# Patient Record
Sex: Female | Born: 1955 | Race: Black or African American | Hispanic: No | Smoking: Never smoker
Health system: Southern US, Community
[De-identification: ages and names within clinical notes are randomized; demographics above are authoritative.]

## PROBLEM LIST (undated history)

## (undated) DIAGNOSIS — G500 Trigeminal neuralgia: Secondary | ICD-10-CM

## (undated) DIAGNOSIS — I341 Nonrheumatic mitral (valve) prolapse: Secondary | ICD-10-CM

## (undated) DIAGNOSIS — I10 Essential (primary) hypertension: Secondary | ICD-10-CM

## (undated) DIAGNOSIS — T7840XA Allergy, unspecified, initial encounter: Secondary | ICD-10-CM

## (undated) DIAGNOSIS — E785 Hyperlipidemia, unspecified: Secondary | ICD-10-CM

## (undated) DIAGNOSIS — E079 Disorder of thyroid, unspecified: Secondary | ICD-10-CM

## (undated) HISTORY — DX: Hyperlipidemia, unspecified: E78.5

## (undated) HISTORY — DX: Nonrheumatic mitral (valve) prolapse: I34.1

## (undated) HISTORY — DX: Allergy, unspecified, initial encounter: T78.40XA

## (undated) HISTORY — PX: OTHER SURGICAL HISTORY: SHX169

## (undated) HISTORY — DX: Disorder of thyroid, unspecified: E07.9

## (undated) HISTORY — DX: Essential (primary) hypertension: I10

## (undated) HISTORY — DX: Trigeminal neuralgia: G50.0

---

## 1997-08-09 ENCOUNTER — Other Ambulatory Visit: Admission: RE | Admit: 1997-08-09 | Discharge: 1997-08-09 | Payer: Self-pay | Admitting: Internal Medicine

## 2000-04-05 ENCOUNTER — Encounter: Payer: Self-pay | Admitting: Emergency Medicine

## 2000-04-05 ENCOUNTER — Observation Stay (HOSPITAL_COMMUNITY): Admission: EM | Admit: 2000-04-05 | Discharge: 2000-04-05 | Payer: Self-pay | Admitting: Emergency Medicine

## 2000-04-20 ENCOUNTER — Ambulatory Visit (HOSPITAL_COMMUNITY): Admission: RE | Admit: 2000-04-20 | Discharge: 2000-04-20 | Payer: Self-pay | Admitting: Internal Medicine

## 2006-02-09 HISTORY — PX: ABDOMINAL HYSTERECTOMY: SHX81

## 2006-03-03 ENCOUNTER — Other Ambulatory Visit: Admission: RE | Admit: 2006-03-03 | Discharge: 2006-03-03 | Payer: Self-pay | Admitting: *Deleted

## 2007-01-24 ENCOUNTER — Inpatient Hospital Stay (HOSPITAL_COMMUNITY): Admission: RE | Admit: 2007-01-24 | Discharge: 2007-01-26 | Payer: Self-pay | Admitting: Obstetrics and Gynecology

## 2007-01-24 ENCOUNTER — Encounter (INDEPENDENT_AMBULATORY_CARE_PROVIDER_SITE_OTHER): Payer: Self-pay | Admitting: Obstetrics and Gynecology

## 2008-10-21 ENCOUNTER — Emergency Department (HOSPITAL_COMMUNITY): Admission: EM | Admit: 2008-10-21 | Discharge: 2008-10-22 | Payer: Self-pay | Admitting: Emergency Medicine

## 2010-03-03 ENCOUNTER — Encounter: Payer: Self-pay | Admitting: Family Medicine

## 2010-04-21 ENCOUNTER — Other Ambulatory Visit: Payer: Self-pay | Admitting: Family Medicine

## 2010-04-21 DIAGNOSIS — Z1231 Encounter for screening mammogram for malignant neoplasm of breast: Secondary | ICD-10-CM

## 2010-04-25 ENCOUNTER — Ambulatory Visit
Admission: RE | Admit: 2010-04-25 | Discharge: 2010-04-25 | Disposition: A | Discharge status: Home / Self Care | Payer: Managed Care, Other (non HMO) | Source: Ambulatory Visit | Attending: Family Medicine | Admitting: Family Medicine

## 2010-04-25 DIAGNOSIS — Z1231 Encounter for screening mammogram for malignant neoplasm of breast: Secondary | ICD-10-CM

## 2010-05-08 ENCOUNTER — Inpatient Hospital Stay (HOSPITAL_COMMUNITY)
Admission: AD | Admit: 2010-05-08 | Discharge: 2010-05-09 | DRG: 247 | Disposition: A | Discharge status: Home / Self Care | Payer: Managed Care, Other (non HMO) | Source: Ambulatory Visit | Attending: Cardiology | Admitting: Cardiology

## 2010-05-08 DIAGNOSIS — Z7982 Long term (current) use of aspirin: Secondary | ICD-10-CM

## 2010-05-08 DIAGNOSIS — I200 Unstable angina: Principal | ICD-10-CM | POA: Diagnosis present

## 2010-05-08 LAB — CBC
HCT: 39.7 % (ref 36.0–46.0)
Hemoglobin: 13.5 g/dL (ref 12.0–15.0)
MCH: 26.5 pg (ref 26.0–34.0)
MCHC: 34 g/dL (ref 30.0–36.0)
MCV: 77.8 fL — ABNORMAL LOW (ref 78.0–100.0)
Platelets: 250 10*3/uL (ref 150–400)
RBC: 5.1 MIL/uL (ref 3.87–5.11)
RDW: 13 % (ref 11.5–15.5)
WBC: 6.2 10*3/uL (ref 4.0–10.5)

## 2010-05-08 LAB — LIPID PANEL
LDL Cholesterol: 95 mg/dL (ref 0–99)
Triglycerides: 90 mg/dL (ref <150)

## 2010-05-08 LAB — COMPREHENSIVE METABOLIC PANEL
ALT: 26 U/L (ref 0–35)
AST: 25 U/L (ref 0–37)
Albumin: 3.9 g/dL (ref 3.5–5.2)
Alkaline Phosphatase: 53 U/L (ref 39–117)
GFR calc Af Amer: >60 mL/min (ref >60)
Potassium: 4 mEq/L (ref 3.5–5.1)
Sodium: 139 mEq/L (ref 135–145)
Total Protein: 7.4 g/dL (ref 6.0–8.3)

## 2010-05-08 LAB — TSH: TSH: 3.444 u[IU]/mL (ref 0.350–4.500)

## 2010-05-08 LAB — POCT ACTIVATED CLOTTING TIME
Activated Clotting Time: 181 seconds
Activated Clotting Time: 587 seconds

## 2010-05-09 LAB — CARDIAC PANEL(CRET KIN+CKTOT+MB+TROPI)
CK, MB: 4.3 ng/mL — ABNORMAL HIGH (ref 0.3–4.0)
CK, MB: 5.6 ng/mL — ABNORMAL HIGH (ref 0.3–4.0)
Relative Index: 1.1 (ref 0.0–2.5)
Total CK: 344 U/L — ABNORMAL HIGH (ref 7–177)
Troponin I: 0.26 ng/mL — ABNORMAL HIGH (ref 0.00–0.06)

## 2010-05-09 LAB — BASIC METABOLIC PANEL
CO2: 29 mEq/L (ref 19–32)
Calcium: 9.1 mg/dL (ref 8.4–10.5)
GFR calc Af Amer: >60 mL/min (ref >60)
GFR calc non Af Amer: 55 mL/min — ABNORMAL LOW (ref >60)
Sodium: 138 mEq/L (ref 135–145)

## 2010-05-09 LAB — CBC
Hemoglobin: 11.8 g/dL — ABNORMAL LOW (ref 12.0–15.0)
MCHC: 33.2 g/dL (ref 30.0–36.0)

## 2010-05-16 LAB — DIFFERENTIAL
Basophils Relative: 1 % (ref 0–1)
Eosinophils Absolute: 0.3 10*3/uL (ref 0.0–0.7)
Eosinophils Relative: 5 % (ref 0–5)
Neutrophils Relative %: 49 % (ref 43–77)

## 2010-05-16 LAB — CBC
MCHC: 33.2 g/dL (ref 30.0–36.0)
MCV: 78 fL (ref 78.0–100.0)
Platelets: 251 10*3/uL (ref 150–400)
RDW: 13.1 % (ref 11.5–15.5)

## 2010-05-16 LAB — URINALYSIS, ROUTINE W REFLEX MICROSCOPIC
Bilirubin Urine: NEGATIVE
Hgb urine dipstick: NEGATIVE
Ketones, ur: NEGATIVE mg/dL
Protein, ur: NEGATIVE mg/dL
Urobilinogen, UA: 0.2 mg/dL (ref 0.0–1.0)

## 2010-05-16 LAB — POCT I-STAT, CHEM 8
HCT: 41 % (ref 36.0–46.0)
Hemoglobin: 13.9 g/dL (ref 12.0–15.0)
Potassium: 3.5 mEq/L (ref 3.5–5.1)
Sodium: 140 mEq/L (ref 135–145)
TCO2: 28 mmol/L (ref 0–100)

## 2010-05-16 LAB — POCT CARDIAC MARKERS
CKMB, poc: 4.1 ng/mL (ref 1.0–8.0)
Myoglobin, poc: 168 ng/mL (ref 12–200)

## 2010-05-20 DIAGNOSIS — E559 Vitamin D deficiency, unspecified: Secondary | ICD-10-CM | POA: Insufficient documentation

## 2010-05-26 NOTE — Procedures (Signed)
NAME:  JANUS, VLCEK              ACCOUNT NO.:  [id number]  MEDICAL RECORD NO.:  [id number]           PATIENT TYPE:  I  LOCATION:  6527                         FACILITY:  [facility]  PHYSICIAN:  Cristy Hilts. Jacinto Halim, MD       DATE OF BIRTH:  December 04, 1955  DATE OF PROCEDURE:  05/08/2010 DATE OF DISCHARGE:                           CARDIAC CATHETERIZATION   PROCEDURE PERFORMED: 1. Left ventriculography. 2. Selective right and left coronary arteriography. 3. Intravascular ultrasound interrogation of the mid LAD. 4. PTCA and successful angioplasty to the mid LAD with implantation of     a 3.0 x 16-mm drug-eluting Promus Element stent by IVUS guidance.  INDICATIONS:  Ms. Deshia Vanderhoof is a 55 year old female with no significant prior cardiovascular history except for migraine headaches. She was initially evaluated by Dr. Robert Bellow earlier this morning when she presented with heaviness in the middle of her chest with burning sensation radiating to her neck and also to her left arm.  She had this ongoing symptoms for the last 2 days.  Last night, she had a pretty severe that made her go to the Urgent Care.  After evaluation, she was referred to me for further cardiac evaluation and was found to have an abnormal EKG.  On repeat EKG, the inferolateral ST-segment depressions had worsened.  Because of suspicion for unstable angina, she was directly admitted to the hospital and she is now brought to the cardiac catheterization lab for definitive delineation of coronary anatomy.  HEMODYNAMIC DATA:  The left ventricular pressure was 145/30 with end- diastolic pressure of 27 mmHg.  Aortic pressure was 144/72 with a mean of 100 mmHg.  There was no pressure gradient across the aortic valve.  Right coronary artery:  Right coronary artery is a dominant vessel. There is a proximal 20-30% stenosis, but is otherwise smooth and normal.  Left main coronary artery:  Left main coronary artery is very short  and trifurcates immediately.  Circumflex:  Circumflex is a large vessel.  It is smooth and normal.  It gives origin to large obtuse marginal 1 and a small obtuse marginal 1.  Ramus intermediate:  Ramus intermediate is a moderate-caliber vessel, which is smooth and normal.  LAD:  LAD gives origin to a small diagonal 1 of the proximal segment and a large diagonal 2.  Just after the origin of the diagonal 2, there is an ulcerated what appears to be a 50% stenoses.  IVUS interrogation:  IVUS of the LAD revealed the lesion to be significant with the lumen area of 3.71 cm in a large LAD.  There was a significant plaque burden along with ulceration.  INTERVENTION DATA:  Successful IVUS-guided intervention with implantation of a 3.0 x 16-mm drug-eluting Promus Element stent.  This stent was deployed at 10 atmospheric pressure for 60 seconds.  This was followed by a post dilatation with a 3.5 x 6-mm apex [place].  A peak of 14 atmospheric pressure was utilized in the proximal and mid LAD at the inflow of the stent and a maximum of 9 atmospheric pressure was inflated into the distal end of the stent keeping the  balloon within the stent struts.  This was done because the proximal reference was 3.5-mm vessel and the distal reference was 3.0 after the diagonal bifurcation.  With IVUS-guided intervention, excellent wall apposition of the stent struts was evident.  Overall, successful PTCA and stenting of the mid LAD with the implantation of 3.0 x 16-mm drug-eluting Promus stent and postdilated with a 3.5 x 6-mm apex [place] with overall reduction in stenosis from 70-80% to 0% with maintenance of TIMI 3 flow at the end of the procedure. There was no evidence of dissection or thrombus at the end of the procedure both by IVUS and by angiography.  RECOMMENDATIONS:  The patient will be continued on aggressive risk modification.  She is presently started on a small dose of a beta- blocker, but because of  bradycardia, we may not be able to titrate much; however, she will need to be on a statin and probably an ACE inhibitor. She will also be on aspirin and Brilinta has been started.  Total of approximately 165 mL of contrast was utilized for diagnostic and intervention procedure.  TECHNIQUE OF THE PROCEDURE:  Under sterile precautions using a 6-French right radial access, a 5-French TIG4 catheter was advanced in the ascending aorta and selective left and right coronary arteriography was performed.  Intracoronary nitroglycerin was also administered and angiography was repeated.  The catheter was then advanced into the left ventricle.  Left ventriculography was performed in the RAO projection. Catheter was then pulled out of body over a exchange length J-wire.  Using Angiomax for anticoagulation, a Medtronic Intuition guidewire was utilized to cross into the LAD.  After crossing the LAD, intracoronary nitroglycerin was administered, and angiography was performed and a Galaxy IVUS catheter was advanced into the LAD.  Careful pullback was performed and the data was carefully analyzed.  After confirming the severity of stenosis and ulceration, we decided to proceed with intervention.  The lesion was directly stented with a 3.0 x 16-mm drug-eluting Promus stent at 12 atmospheric pressure for 60 seconds.  This was followed by postdilatation with a 3.5 x 8-mm apex [place] at a peak of 16 atmospheric pressure in the proximal end and a peak of 9 atmospheric pressure in the distal end.  Having performed this, repeat IVUS was performed and wall opposition of the stent struts was confirmed.  The guidewire was withdrawn.  Angiography repeated.  Guide catheter disengaged and pulled out of the body.  The patient tolerated the procedure well.  No immediate complications.     Cristy Hilts. Jacinto Halim, MD     JRG/MEDQ  D:  05/08/2010  T:  05/09/2010  Job:  [id number]  cc:   Donnal Moat, MD Electronically Signed  by Yates Decamp MD on 05/26/2010 11:54:05 AM

## 2010-05-26 NOTE — Discharge Summary (Signed)
  NAME:  Melinda, [name]              ACCOUNT NO.:  [id number]  MEDICAL RECORD NO.:  [id number]           PATIENT TYPE:  I  LOCATION:  6527                         FACILITY:  [facility]  PHYSICIAN:  Cristy Hilts. Jacinto Halim, MD       DATE OF BIRTH:  [birth year]  DATE OF ADMISSION:  05/08/2010 DATE OF DISCHARGE:  05/09/2010                              DISCHARGE SUMMARY   DISCHARGE DIAGNOSES: 1. Unstable angina. 2. Coronary artery disease status post successful percutaneous     transluminal coronary angioplasty and stenting of the mid left     anterior descending with implantation of a 3.0 x 16 mm drug-eluting     Promus stent.  This stent was proximally expanded to 3.5 mm and     distally was kept at 3.0 mm and it was IVUS-guided intervention.  HISTORY:  Melinda [name] is a 55 year old female with no significant prior cardiovascular history except for hypothyroidism due to radiation therapy from Graves disease and history of palpitations and mitral valve prolapse.  She was seen by Dr. Robert Bellow at [facility] with chest pain.  She was found to have nonspecific ST-segment changes in the inferolateral leads.  She was then referred towards me for further evaluation the same day.  By the time she came to the office, she had significant ST-segment depression in the inferior and lateral leads, which were new.  Her symptoms were very typical for unstable angina. She was admitted to the hospital and underwent cardiac catheterization the same day.  COURSE IN THE HOSPITAL:  The patient underwent successful PTCA and IVUS- guided intervention to her mid LAD with implantation of a 3.0 x 16 mm drug-eluting Promus stent.  Following this, she did well.  She was ruled out for myocardial infarction.  Her troponins were positive and suggested unstable angina.  LABORATORY DATA IN THE HOSPITAL:  Her TSH was normal at 3.44.  Her lipid profile; total course of 181, triglycerides 90, HDL of 68, LDL of  95.  CBC was within normal limits.  BMP was within normal limits.  Her D-dimer was negative on admission.  Her EKG on the day of discharge had pretty much normalized except for what appears to be sinus bradycardia with left atrial abnormality.  DISCHARGE INSTRUCTIONS:  She will be followed up by Dr. Yates Decamp in about 2-3 weeks.  She will follow with Dr. Celene Skeen on outpatient basis.  DISCHARGE MEDICATIONS: 1. Multivitamin 1 p.o. daily. 2. Allegra 180 mg p.o. daily. 3. Aspirin 81 mg p.o. daily. 4. Synthroid 125 mcg p.o. daily. 5. Coreg 3.125 mg p.o. b.i.d. 6. Crestor 10 mg p.o. daily. 7. Brilinta 90 mg p.o. b.i.d.  She will not stop aspirin or Brilinta until she discusses these with me.     Cristy Hilts. Jacinto Halim, MD     JRG/MEDQ  D:  05/09/2010  T:  05/10/2010  Job:  [id number]  cc:   Charlesetta Shanks  Electronically Signed by Yates Decamp MD on 05/26/2010 11:53:30 AM

## 2010-06-02 ENCOUNTER — Encounter (HOSPITAL_COMMUNITY): Payer: Managed Care, Other (non HMO) | Attending: Cardiology

## 2010-06-02 DIAGNOSIS — Z5189 Encounter for other specified aftercare: Secondary | ICD-10-CM | POA: Insufficient documentation

## 2010-06-02 DIAGNOSIS — I2510 Atherosclerotic heart disease of native coronary artery without angina pectoris: Secondary | ICD-10-CM | POA: Insufficient documentation

## 2010-06-02 DIAGNOSIS — I200 Unstable angina: Secondary | ICD-10-CM | POA: Insufficient documentation

## 2010-06-02 DIAGNOSIS — Z9861 Coronary angioplasty status: Secondary | ICD-10-CM | POA: Insufficient documentation

## 2010-06-02 DIAGNOSIS — R079 Chest pain, unspecified: Secondary | ICD-10-CM | POA: Insufficient documentation

## 2010-06-02 DIAGNOSIS — Z7982 Long term (current) use of aspirin: Secondary | ICD-10-CM | POA: Insufficient documentation

## 2010-06-04 ENCOUNTER — Encounter (HOSPITAL_COMMUNITY): Payer: Managed Care, Other (non HMO)

## 2010-06-06 ENCOUNTER — Encounter (HOSPITAL_COMMUNITY): Payer: Managed Care, Other (non HMO) | Attending: Cardiology

## 2010-06-06 ENCOUNTER — Encounter (HOSPITAL_COMMUNITY): Payer: Managed Care, Other (non HMO)

## 2010-06-06 DIAGNOSIS — Z9861 Coronary angioplasty status: Secondary | ICD-10-CM | POA: Insufficient documentation

## 2010-06-06 DIAGNOSIS — R079 Chest pain, unspecified: Secondary | ICD-10-CM | POA: Insufficient documentation

## 2010-06-06 DIAGNOSIS — Z5189 Encounter for other specified aftercare: Secondary | ICD-10-CM | POA: Insufficient documentation

## 2010-06-06 DIAGNOSIS — I200 Unstable angina: Secondary | ICD-10-CM | POA: Insufficient documentation

## 2010-06-06 DIAGNOSIS — I2510 Atherosclerotic heart disease of native coronary artery without angina pectoris: Secondary | ICD-10-CM | POA: Insufficient documentation

## 2010-06-06 DIAGNOSIS — Z7982 Long term (current) use of aspirin: Secondary | ICD-10-CM | POA: Insufficient documentation

## 2010-06-09 ENCOUNTER — Encounter (HOSPITAL_COMMUNITY): Payer: Managed Care, Other (non HMO)

## 2010-06-11 ENCOUNTER — Encounter (HOSPITAL_COMMUNITY): Payer: Managed Care, Other (non HMO) | Attending: Cardiology

## 2010-06-11 DIAGNOSIS — I200 Unstable angina: Secondary | ICD-10-CM | POA: Insufficient documentation

## 2010-06-11 DIAGNOSIS — Z9861 Coronary angioplasty status: Secondary | ICD-10-CM | POA: Insufficient documentation

## 2010-06-11 DIAGNOSIS — I2510 Atherosclerotic heart disease of native coronary artery without angina pectoris: Secondary | ICD-10-CM | POA: Insufficient documentation

## 2010-06-11 DIAGNOSIS — Z5189 Encounter for other specified aftercare: Secondary | ICD-10-CM | POA: Insufficient documentation

## 2010-06-11 DIAGNOSIS — R079 Chest pain, unspecified: Secondary | ICD-10-CM | POA: Insufficient documentation

## 2010-06-11 DIAGNOSIS — Z7982 Long term (current) use of aspirin: Secondary | ICD-10-CM | POA: Insufficient documentation

## 2010-06-13 ENCOUNTER — Encounter (HOSPITAL_COMMUNITY): Payer: Managed Care, Other (non HMO)

## 2010-06-16 ENCOUNTER — Encounter (HOSPITAL_COMMUNITY): Payer: Managed Care, Other (non HMO)

## 2010-06-18 ENCOUNTER — Encounter (HOSPITAL_COMMUNITY): Payer: Managed Care, Other (non HMO)

## 2010-06-20 ENCOUNTER — Encounter (HOSPITAL_COMMUNITY): Payer: Managed Care, Other (non HMO)

## 2010-06-23 ENCOUNTER — Encounter (HOSPITAL_COMMUNITY): Payer: Managed Care, Other (non HMO)

## 2010-06-24 NOTE — Op Note (Signed)
NAME:  Melinda [name], Melinda [name]              ACCOUNT NO.:  [id number]   MEDICAL RECORD NO.:  [id number]          PATIENT TYPE:  INP   LOCATION:  9310                          [facility]   PHYSICIAN:  Charles A. Delcambre, MDDATE OF BIRTH:  [birth year]   DATE OF PROCEDURE:  01/24/2007  DATE OF DISCHARGE:                               OPERATIVE REPORT   PREOPERATIVE DIAGNOSES:  1. Menorrhagia.  2. Pelvic pain.  3. An 18-week uterine leiomyomata.   POSTOPERATIVE DIAGNOSES:  1. Menorrhagia.  2. Pelvic pain.  3. An 18-week uterine leiomyomata.   PROCEDURE:  Transabdominal hysterectomy, bilateral salpingo-  oophorectomy.   SURGEON:  Charles A. Sydnee Cabal, M.D.   ASSISTANT:  Gerald Leitz, M.D.   COMPLICATIONS:  None.   ESTIMATED BLOOD LOSS:  300 mL.   FINDINGS:  Uterus normal.  Ovaries:  Multiple leiomyomata.  Normal  external genitalia .   SPECIMENS:  628 g uterus, tubes, and ovaries.   ESTIMATED BLOOD LOSS:  300 mL.   COMPLICATIONS:  None.   ANESTHESIA:  General by the endotracheal route.   INSTRUMENT, SPONGE, AND NEEDLE COUNT:  Correct x2.   DESCRIPTION OF PROCEDURE:  The patient was taken to the operating room  and placed in the supine position.  Sterile prep and drape was  undertaken after she was put under general anesthetic.  Foley catheter  was in place.  A vertical skin incision was made to the umbilicus in the  area of the old scar, but as it was felt the old scar was somewhat off  to the left, I did not remove this scar or go through it for the most  part.  A knife was carried down to fascia.  Fascia was incised with the  knife and Mayo scissors.  Rectus muscles were bluntly dissected.  Peritoneum was entered with Metzenbaum scissors, without damage to  bowel, bladder, or vascular structures.  Peritoneum was taken superiorly  and inferiorly, without damage to the bladder or surrounding structures.  A uterine Balfour retractor was placed.  Bladder blade was placed.  Uterine cornual regions were grasped with Kelly clamps, and the uterus  was carefully lifted out of the pelvis and out of the abdomen.  This  clearly helped to observe the ureters on either side.  Round ligaments  were transected and tied with 0 Vicryl and held.  Bladder was taken down  the lower uterine segment in preparation to drop off for the bladder  flap.  Infundibulopelvic pedicle was isolated on either side, well clear  of the ureter, and free tied and then transfixion stitched.  Hemostasis  was good. Bladder was taken off the lower uterine segment.  Minor  electrocautery was used on the uterus, with isolation of the uterine  vessels on either side.  These were taken with simple stitches and a  second pedicle on either side well clear the bladder.  I did enter the  vagina on either side.  Cervix was amputated from the vagina, and  Richardson angle sutures were placed on the uterosacral ligaments and  vaginal angle bilaterally.  Running 0 Vicryl was  used to close the cuff.  Minor electrocautery was used and a 2-0 Vicryl on an SH was used to  achieve hemostasis at the edge of the cuff.  There was no damage to  bowel or bladder that was noted.  Irrigation was carried out.  Minor  electrocautery was used on the cuff.  Hemostasis was excellent.  One  figure-of-eight suture was placed near the left angle of the cuff, with  hemostasis resulting.  Irrigation was carried out again.  All pedicles  were visualized and of good hemostasis.  Procedure was terminated.  Three moistened laps that had been used to pack the bowel out of the  pelvis were removed.  Balfour retractor was removed.  En-bloc closure  using #1 PDS was then used to close the fascia and peritoneum superiorly  and then from inferior side.  Subcutaneous hemostasis was excellent,  with minor electrocautery.  The skin was closed with sterile skin clips.  A sterile dressing was applied.  The patient was taken to recovery, with   physician in attendance.      Charles A. Sydnee Cabal, MD  Electronically Signed     CAD/MEDQ  D:  01/24/2007  T:  01/25/2007  Job:  [id number]

## 2010-06-24 NOTE — H&P (Signed)
NAME:  Melinda [name], DUAN NO.:  [id number]   MEDICAL RECORD NO.:  [id number]           PATIENT TYPE:   LOCATION:                                 FACILITY:   PHYSICIAN:  Charles A. Delcambre, MDDATE OF BIRTH:  [birth year]   DATE OF ADMISSION:  01/25/2007  DATE OF DISCHARGE:                              HISTORY & PHYSICAL   She is to be admitted on January 25, 2007, to undergo transabdominal  hysterectomy secondary to periods 21-25 days lasting 7-10 days, four  heavy days where she has the pad and the tampon having to be changed  every hour.  In January 2008, hemoglobin was 8.0; in September 2008,  hemoglobin was 7.8.  She is taking Ferro-Sequels once a day causing  constipation and nausea/vomiting.  She has had quite a period this last  period as well.  She is a 55 year old gravida 1, para 1.   PAST MEDICAL HISTORY:  1. Hypothyroidism.  2. Mitral valve prolapse.  3. Chronic sinusitis.   SURGICAL HISTORY:  One C-section.   MEDICATIONS:  1. Synthroid 75 mcg daily.  2. Atenolol 25 mg daily.  3. Allegra 180 mg daily.  4. Aspirin 81 mg a day and she has been asked to not take her aspirin.   ALLERGIES:  ASPIRIN IN POWDER FORM, BC POWDERS   SOCIAL HISTORY:  Negative template.  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   FAMILY HISTORY:  Positive for hypertension.  No other major diseases.   REVIEW OF SYSTEMS:  Symptomatic anemia in the past. Denies fevers or  chills, no rashes or lesions. No seasonal allergies. No chest pain,  shortness of breath, wheezing, diarrhea, constipation, hematochezia,  galactorrhea or emotional changes.   PHYSICAL EXAMINATION:  Alert and oriented x3.  HEENT:  Grossly within normal limits.  NECK:  Supple, no thyromegaly.  LUNGS:  Clear bilaterally.  HEART:  Regular rate and rhythm without murmur, rub or gallop.  BREASTS:  Symmetrical.  Otherwise, exam per primary care.  ABDOMEN:  Fundus is significant for fibroids enlarged all the way up  to  the umbilicus.  PELVIC:  Normal external female genitalia.  Bartholin's, urethra,  Skene's within normal limits.  Vagina normal.   ASSESSMENT:  1. Menorrhagia.  2. Fibroids.  3. Chronic anemia.   PLAN:  Transabdominal hysterectomy.  She gives informed consent.  Accepts risks of infection, bleeding, bowel and bladder injury, blood  transfusion risks including HIV and hepatitis, and risk of ureteral  damage, blood clots, DVT.  All questions answered.  She gives informed  and consent and we will proceed with surgery.      Charles A. Sydnee Cabal, MD  Electronically Signed     CAD/MEDQ  D:  01/18/2007  T:  01/19/2007  Job:  [id number]

## 2010-06-25 ENCOUNTER — Encounter (HOSPITAL_COMMUNITY): Payer: Managed Care, Other (non HMO)

## 2010-06-27 ENCOUNTER — Encounter (HOSPITAL_COMMUNITY): Payer: Managed Care, Other (non HMO)

## 2010-06-27 NOTE — Discharge Summary (Signed)
NAME:  Melinda [name], Melinda [name]              ACCOUNT NO.:  [id number]   MEDICAL RECORD NO.:  [id number]          PATIENT TYPE:  INP   LOCATION:  9310                          [facility]   PHYSICIAN:  Charles A. Delcambre, MDDATE OF BIRTH:  15-Oct-1955   DATE OF ADMISSION:  01/24/2007  DATE OF DISCHARGE:  01/26/2007                               DISCHARGE SUMMARY   DISCHARGE DIAGNOSES:  Menorrhagia, pelvic pain, 18 week uterine  leiomyomata.   PATHOLOGY:  Returned benign uterine leiomyomata. Normal ovaries and  benign tubes with benign paratubal cyst also.   LABORATORY DATA:  Initial hemoglobin 10.1, postoperative hemoglobin 7.2,  repeated 6.7, repeated at noon 7.3 and deemed to be stable.   DISPOSITION:  The patient discharged to follow up in the clinic.  Precautions with increased dizziness or weakness, temperature greater  than 100 degrees, no lifting greater than 20 pounds for 4 weeks or  driving for two weeks.    Percocet 5/325 one to two p.o. q.4 hours p.r.n. #40, Motrin 800 mg 1  p.o. q.8 hours p.r.n. #30, Ferrous Sequels 1 pill b.i.d.   History and physical dictated and on the chart.   The procedure was accomplished the first day, TVH and A&P.   DISCHARGE DIAGNOSES:  Menorrhagia, pelvic pain and 18 week fibroids.   She was doing well postop check and postop day 1 continued to do well.  She was having some nausea, was given Zofran p.r.n. She was given a  general diet and tolerated her general diet postop day 2.  Having passed  spontaneous flatus and tolerating diet, tolerating pills oral pain  medication and asymptomatic with a low hematocrit she was discharged  home as follows above.  History and physical is on the chart.  Uterus,  tubes and ovaries to pathology.      Charles A. Sydnee Cabal, MD  Electronically Signed     CAD/MEDQ  D:  02/10/2007  T:  02/10/2007  Job:  [id number]

## 2010-06-30 ENCOUNTER — Encounter (HOSPITAL_COMMUNITY): Payer: Managed Care, Other (non HMO)

## 2010-07-02 ENCOUNTER — Encounter (HOSPITAL_COMMUNITY): Payer: Managed Care, Other (non HMO)

## 2010-07-04 ENCOUNTER — Encounter (HOSPITAL_COMMUNITY): Payer: Managed Care, Other (non HMO)

## 2010-07-07 ENCOUNTER — Encounter (HOSPITAL_COMMUNITY): Payer: Managed Care, Other (non HMO)

## 2010-07-09 ENCOUNTER — Encounter (HOSPITAL_COMMUNITY): Payer: Managed Care, Other (non HMO)

## 2010-07-11 ENCOUNTER — Encounter (HOSPITAL_COMMUNITY): Payer: Managed Care, Other (non HMO)

## 2010-07-14 ENCOUNTER — Encounter (HOSPITAL_COMMUNITY): Payer: Managed Care, Other (non HMO)

## 2010-07-16 ENCOUNTER — Encounter (HOSPITAL_COMMUNITY): Payer: Managed Care, Other (non HMO)

## 2010-07-18 ENCOUNTER — Encounter (HOSPITAL_COMMUNITY): Payer: Managed Care, Other (non HMO)

## 2010-07-21 ENCOUNTER — Encounter (HOSPITAL_COMMUNITY): Payer: Managed Care, Other (non HMO)

## 2010-07-23 ENCOUNTER — Encounter (HOSPITAL_COMMUNITY): Payer: Managed Care, Other (non HMO)

## 2010-07-25 ENCOUNTER — Encounter (HOSPITAL_COMMUNITY): Payer: Managed Care, Other (non HMO)

## 2010-07-28 ENCOUNTER — Encounter (HOSPITAL_COMMUNITY): Payer: Managed Care, Other (non HMO)

## 2010-07-30 ENCOUNTER — Encounter (HOSPITAL_COMMUNITY): Payer: Managed Care, Other (non HMO)

## 2010-08-01 ENCOUNTER — Encounter (HOSPITAL_COMMUNITY): Payer: Managed Care, Other (non HMO)

## 2010-08-04 ENCOUNTER — Encounter (HOSPITAL_COMMUNITY): Payer: Managed Care, Other (non HMO)

## 2010-08-06 ENCOUNTER — Encounter (HOSPITAL_COMMUNITY): Payer: Managed Care, Other (non HMO)

## 2010-08-08 ENCOUNTER — Encounter (HOSPITAL_COMMUNITY): Payer: Managed Care, Other (non HMO)

## 2010-08-11 ENCOUNTER — Encounter (HOSPITAL_COMMUNITY): Payer: Managed Care, Other (non HMO)

## 2010-08-13 ENCOUNTER — Encounter (HOSPITAL_COMMUNITY): Payer: Managed Care, Other (non HMO)

## 2010-08-15 ENCOUNTER — Encounter (HOSPITAL_COMMUNITY): Payer: Managed Care, Other (non HMO)

## 2010-08-18 ENCOUNTER — Encounter (HOSPITAL_COMMUNITY): Payer: Managed Care, Other (non HMO)

## 2010-08-20 ENCOUNTER — Encounter (HOSPITAL_COMMUNITY): Payer: Managed Care, Other (non HMO)

## 2010-08-22 ENCOUNTER — Encounter (HOSPITAL_COMMUNITY): Payer: Managed Care, Other (non HMO)

## 2010-08-25 ENCOUNTER — Encounter (HOSPITAL_COMMUNITY): Payer: Managed Care, Other (non HMO)

## 2010-08-27 ENCOUNTER — Encounter (HOSPITAL_COMMUNITY): Payer: Managed Care, Other (non HMO)

## 2010-08-29 ENCOUNTER — Encounter (HOSPITAL_COMMUNITY): Payer: Managed Care, Other (non HMO)

## 2010-09-01 ENCOUNTER — Encounter (HOSPITAL_COMMUNITY): Payer: Managed Care, Other (non HMO)

## 2010-09-03 ENCOUNTER — Encounter (HOSPITAL_COMMUNITY): Payer: Managed Care, Other (non HMO)

## 2010-09-05 ENCOUNTER — Encounter (HOSPITAL_COMMUNITY): Payer: Managed Care, Other (non HMO)

## 2010-09-05 DIAGNOSIS — I2510 Atherosclerotic heart disease of native coronary artery without angina pectoris: Secondary | ICD-10-CM | POA: Insufficient documentation

## 2010-11-14 LAB — CBC
Hemoglobin: 7.2 — CL
MCHC: 32.9
RBC: 2.74 — ABNORMAL LOW
WBC: 14.7 — ABNORMAL HIGH

## 2010-11-14 LAB — HEMOGLOBIN AND HEMATOCRIT, BLOOD
HCT: 20.6 — ABNORMAL LOW
HCT: 21.9 — ABNORMAL LOW
Hemoglobin: 6.7 — CL

## 2010-11-14 LAB — TYPE AND SCREEN: Antibody Screen: NEGATIVE

## 2010-11-17 LAB — CBC
HCT: 30.4 — ABNORMAL LOW
Platelets: 298
RDW: 16.7 — ABNORMAL HIGH
WBC: 4.8

## 2010-11-17 LAB — COMPREHENSIVE METABOLIC PANEL
ALT: 17
CO2: 32
Calcium: 8.8
Chloride: 103
GFR calc non Af Amer: >60
Glucose, Bld: 124 — ABNORMAL HIGH
Sodium: 140
Total Bilirubin: 0.4

## 2010-11-17 LAB — PREGNANCY, URINE: Preg Test, Ur: NEGATIVE

## 2011-03-23 ENCOUNTER — Other Ambulatory Visit: Payer: Self-pay | Admitting: Physician Assistant

## 2011-06-01 DIAGNOSIS — B009 Herpesviral infection, unspecified: Secondary | ICD-10-CM | POA: Insufficient documentation

## 2011-06-23 ENCOUNTER — Encounter: Payer: Self-pay | Admitting: Gastroenterology

## 2011-06-24 ENCOUNTER — Other Ambulatory Visit: Payer: Self-pay | Admitting: Family Medicine

## 2011-06-24 DIAGNOSIS — Z1231 Encounter for screening mammogram for malignant neoplasm of breast: Secondary | ICD-10-CM

## 2011-07-09 ENCOUNTER — Ambulatory Visit: Payer: Self-pay | Admitting: Physician Assistant

## 2011-07-09 VITALS — BP 145/75 | HR 62 | Temp 98.1°F | Resp 18 | Ht 65.0 in | Wt 166.8 lb

## 2011-07-09 DIAGNOSIS — Z111 Encounter for screening for respiratory tuberculosis: Secondary | ICD-10-CM

## 2011-07-09 NOTE — Progress Notes (Signed)
PPD Placement note  Melinda [name], 56 y.o. female is here today for placement of PPD test  Reason for PPD test: Screening  1. Has the patient ever had a TB Skin Test?: yes  2. Has the patient had a TB Skin Test in the past 6 weeks? no   3. Has the patient ever had a positive reaction? no   4. Has the patient ever had a BCG vaccine? no  5. Has the patient ever had Tuberculosis? no  6. Has the patient been in recent contact with anyone known or suspected of having     active TB disease?: no       Verified in allergy area and with patient that the patient is not allergic to the products PPD is made of (Phenol or Tween). Yes  Is patient taking any oral or IV steroid medication now or have they taken it in the last month? no  PPD placed on 07/09/2011 at 2005.  Patient advised to return for reading within 48-72 hours.

## 2011-07-12 ENCOUNTER — Ambulatory Visit (INDEPENDENT_AMBULATORY_CARE_PROVIDER_SITE_OTHER): Payer: Self-pay

## 2011-07-12 DIAGNOSIS — Z111 Encounter for screening for respiratory tuberculosis: Secondary | ICD-10-CM

## 2011-07-12 LAB — TB SKIN TEST: TB Skin Test: NEGATIVE

## 2011-07-16 ENCOUNTER — Ambulatory Visit: Payer: Managed Care, Other (non HMO)

## 2011-07-20 ENCOUNTER — Ambulatory Visit
Admission: RE | Admit: 2011-07-20 | Discharge: 2011-07-20 | Disposition: A | Discharge status: Home / Self Care | Payer: Managed Care, Other (non HMO) | Source: Ambulatory Visit | Attending: Family Medicine | Admitting: Family Medicine

## 2011-07-20 DIAGNOSIS — Z1231 Encounter for screening mammogram for malignant neoplasm of breast: Secondary | ICD-10-CM

## 2011-07-30 ENCOUNTER — Encounter: Payer: Managed Care, Other (non HMO) | Admitting: Gastroenterology

## 2011-08-21 ENCOUNTER — Encounter: Payer: Managed Care, Other (non HMO) | Admitting: Gastroenterology

## 2011-09-30 ENCOUNTER — Ambulatory Visit (AMBULATORY_SURGERY_CENTER): Payer: Managed Care, Other (non HMO) | Admitting: *Deleted

## 2011-09-30 VITALS — Ht 63.5 in | Wt 165.0 lb

## 2011-09-30 DIAGNOSIS — Z1211 Encounter for screening for malignant neoplasm of colon: Secondary | ICD-10-CM

## 2011-09-30 MED ORDER — NA SULFATE-K SULFATE-MG SULF 17.5-3.13-1.6 GM/177ML PO SOLN: 1.0000 | Freq: Once | ORAL | Status: DC

## 2011-09-30 NOTE — Progress Notes (Addendum)
Pt has no allergy to soy or egg products  Pt is on Brilinta d/t having one cardiac stent placed in March 2012.  Her cardiologist has been weaning her off of this medicine and she is to stop it permanently on 10-04-11.  She will be taking Aspirin 81 mg, 2 tablets daily.  Note sent to Dr. Arlyce Dice, just as an [facility] with Dr. Christella Hartigan about above.  He states she will be ok to have her procedure as scheduled

## 2011-10-09 ENCOUNTER — Ambulatory Visit (AMBULATORY_SURGERY_CENTER): Payer: Managed Care, Other (non HMO) | Admitting: Gastroenterology

## 2011-10-09 VITALS — BP 134/68 | HR 57 | Temp 97.4°F | Resp 16 | Ht 63.5 in | Wt 165.0 lb

## 2011-10-09 DIAGNOSIS — Z1211 Encounter for screening for malignant neoplasm of colon: Secondary | ICD-10-CM

## 2011-10-09 DIAGNOSIS — D126 Benign neoplasm of colon, unspecified: Secondary | ICD-10-CM

## 2011-10-09 MED ORDER — SODIUM CHLORIDE 0.9 % IV SOLN: 500.0000 mL | INTRAVENOUS | Status: DC

## 2011-10-09 NOTE — Progress Notes (Signed)
Patient did not experience any of the following events: a burn prior to discharge; a fall within the facility; wrong site/side/patient/procedure/implant event; or a hospital transfer or hospital admission upon discharge from the facility. (G8907) Patient did not have preoperative order for IV antibiotic SSI prophylaxis. (G8918)  

## 2011-10-09 NOTE — Op Note (Signed)
[facility] [address] [place], [address]   COLONOSCOPY PROCEDURE REPORT  PATIENT: Melinda [name], Melinda [name]  MR#: [id number] BIRTHDATE: Feb 13, 1955 , 56  yrs. old GENDER: Female ENDOSCOPIST: Louis Meckel, MD REFERRED XB:JYNWG Bouska, M.D. PROCEDURE DATE:  10/09/2011 PROCEDURE:   Colonoscopy with snare polypectomy ASA CLASS:   Class II INDICATIONS:average risk screening. MEDICATIONS: MAC sedation, administered by CRNA and Propofol (Diprivan) 140 mg IV  DESCRIPTION OF PROCEDURE:   After the risks benefits and alternatives of the procedure were thoroughly explained, informed consent was obtained.  A digital rectal exam revealed no abnormalities of the rectum.   The LB CF-[id number]  endoscope was introduced through the anus and advanced to the cecum, which was identified by both the appendix and ileocecal valve. No adverse events experienced.   The quality of the prep was excellent, using MoviPrep  The instrument was then slowly withdrawn as the colon was fully examined.      COLON FINDINGS: A flat polyp measuring 2-3 mm in size was found in the descending colon and proximal descending colon.  A polypectomy was performed with a cold snare.  The resection was complete and the polyp tissue was completely retrieved.   The colon mucosa was otherwise normal.  Retroflexed views revealed no abnormalities. The time to cecum=3 minutes 44 seconds.  Withdrawal time=11 minutes 42 seconds.  The scope was withdrawn and the procedure completed. COMPLICATIONS: There were no complications.  ENDOSCOPIC IMPRESSION: 1.   Flat polyp measuring 2-3 mm in size was found in the descending colon and proximal descending colon; polypectomy was performed with a cold snare 2.   The colon mucosa was otherwise normal  RECOMMENDATIONS: 1.  If the polyp(s) removed today are proven to be adenomatous (pre-cancerous) polyps, you will need a repeat colonoscopy in 5 years.  Otherwise you  should continue to follow colorectal cancer screening guidelines for "routine risk" patients with colonoscopy in 10 years.  You will receive a letter within 1-2 weeks with the results of your biopsy as well as final recommendations.  Please call my office if you have not received a letter after 3 weeks. 2.   Resume Brilinta in am   eSigned:  Louis Meckel, MD 10/09/2011 9:16 AM   cc:

## 2011-10-09 NOTE — Patient Instructions (Addendum)
Restart Brilinta in am Discharge instructions given with verbal understanding. Handout on polyps given. Resume previous medications. YOU HAD AN ENDOSCOPIC PROCEDURE TODAY AT THE [facility]: Refer to the procedure report that was given to you for any specific questions about what was found during the examination.  If the procedure report does not answer your questions, please call your gastroenterologist to clarify.  If you requested that your care partner not be given the details of your procedure findings, then the procedure report has been included in a sealed envelope for you to review at your convenience later.  YOU SHOULD EXPECT: Some feelings of bloating in the abdomen. Passage of more gas than usual.  Walking can help get rid of the air that was put into your GI tract during the procedure and reduce the bloating. If you had a lower endoscopy (such as a colonoscopy or flexible sigmoidoscopy) you may notice spotting of blood in your stool or on the toilet paper. If you underwent a bowel prep for your procedure, then you may not have a normal bowel movement for a few days.  DIET: Your first meal following the procedure should be a light meal and then it is ok to progress to your normal diet.  A half-sandwich or bowl of soup is an example of a good first meal.  Heavy or fried foods are harder to digest and may make you feel nauseous or bloated.  Likewise meals heavy in dairy and vegetables can cause extra gas to form and this can also increase the bloating.  Drink plenty of fluids but you should avoid alcoholic beverages for 24 hours.  ACTIVITY: Your care partner should take you home directly after the procedure.  You should plan to take it easy, moving slowly for the rest of the day.  You can resume normal activity the day after the procedure however you should NOT DRIVE or use heavy machinery for 24 hours (because of the sedation medicines used during the test).    SYMPTOMS TO REPORT  IMMEDIATELY: A gastroenterologist can be reached at any hour.  During normal business hours, 8:30 AM to 5:00 PM Monday through Friday, call [phone].  After hours and on weekends, please call the GI answering service at [phone] who will take a message and have the physician on call contact you.   Following lower endoscopy (colonoscopy or flexible sigmoidoscopy):  Excessive amounts of blood in the stool  Significant tenderness or worsening of abdominal pains  Swelling of the abdomen that is new, acute  Fever of 100F or higher  FOLLOW UP: If any biopsies were taken you will be contacted by phone or by letter within the next 1-3 weeks.  Call your gastroenterologist if you have not heard about the biopsies in 3 weeks.  Our staff will call the home number listed on your records the next business day following your procedure to check on you and address any questions or concerns that you may have at that time regarding the information given to you following your procedure. This is a courtesy call and so if there is no answer at the home number and we have not heard from you through the emergency physician on call, we will assume that you have returned to your regular daily activities without incident.  SIGNATURES/CONFIDENTIALITY: You and/or your care partner have signed paperwork which will be entered into your electronic medical record.  These signatures attest to the fact that that the information above on your  After Visit Summary has been reviewed and is understood.  Full responsibility of the confidentiality of this discharge information lies with you and/or your care-partner.

## 2011-10-13 ENCOUNTER — Telehealth: Payer: Self-pay

## 2011-10-13 NOTE — Telephone Encounter (Signed)
  Follow up Call-  Call back number 10/09/2011  Post procedure Call Back phone  # [phone]  Permission to leave phone message Yes     Patient questions:  Do you have a fever, pain , or abdominal swelling? no Pain Score  0 *  Have you tolerated food without any problems? yes  Have you been able to return to your normal activities? yes  Do you have any questions about your discharge instructions: Diet   no Medications  no Follow up visit  no  Do you have questions or concerns about your Care? no  Actions: * If pain score is 4 or above: No action needed, pain <4.

## 2011-10-19 ENCOUNTER — Encounter: Payer: Self-pay | Admitting: Gastroenterology

## 2012-07-16 DIAGNOSIS — E039 Hypothyroidism, unspecified: Secondary | ICD-10-CM | POA: Insufficient documentation

## 2012-09-01 ENCOUNTER — Other Ambulatory Visit: Payer: Self-pay

## 2012-09-01 DIAGNOSIS — Z1231 Encounter for screening mammogram for malignant neoplasm of breast: Secondary | ICD-10-CM

## 2012-09-13 DIAGNOSIS — G519 Disorder of facial nerve, unspecified: Secondary | ICD-10-CM | POA: Insufficient documentation

## 2012-09-21 ENCOUNTER — Ambulatory Visit
Admission: RE | Admit: 2012-09-21 | Discharge: 2012-09-21 | Disposition: A | Discharge status: Home / Self Care | Payer: BC Managed Care – PPO | Source: Ambulatory Visit

## 2012-09-21 DIAGNOSIS — Z1231 Encounter for screening mammogram for malignant neoplasm of breast: Secondary | ICD-10-CM

## 2013-01-02 DIAGNOSIS — L84 Corns and callosities: Secondary | ICD-10-CM | POA: Insufficient documentation

## 2013-01-02 DIAGNOSIS — Z6827 Body mass index (BMI) 27.0-27.9, adult: Secondary | ICD-10-CM | POA: Insufficient documentation

## 2013-02-09 HISTORY — PX: BUNIONECTOMY: SHX129

## 2013-06-28 DIAGNOSIS — I10 Essential (primary) hypertension: Secondary | ICD-10-CM | POA: Insufficient documentation

## 2013-08-25 ENCOUNTER — Other Ambulatory Visit: Payer: Self-pay

## 2013-08-25 DIAGNOSIS — Z1231 Encounter for screening mammogram for malignant neoplasm of breast: Secondary | ICD-10-CM

## 2013-09-22 ENCOUNTER — Ambulatory Visit
Admission: RE | Admit: 2013-09-22 | Discharge: 2013-09-22 | Disposition: A | Discharge status: Home / Self Care | Payer: BC Managed Care – PPO | Source: Ambulatory Visit

## 2013-09-22 DIAGNOSIS — Z1231 Encounter for screening mammogram for malignant neoplasm of breast: Secondary | ICD-10-CM

## 2014-02-03 ENCOUNTER — Ambulatory Visit (INDEPENDENT_AMBULATORY_CARE_PROVIDER_SITE_OTHER): Payer: BC Managed Care – PPO | Admitting: Family Medicine

## 2014-02-03 VITALS — BP 164/70 | HR 64 | Temp 98.3°F | Resp 16 | Ht 65.0 in | Wt 166.8 lb

## 2014-02-03 DIAGNOSIS — R059 Cough, unspecified: Secondary | ICD-10-CM

## 2014-02-03 DIAGNOSIS — R6889 Other general symptoms and signs: Secondary | ICD-10-CM

## 2014-02-03 DIAGNOSIS — R05 Cough: Secondary | ICD-10-CM

## 2014-02-03 DIAGNOSIS — J0100 Acute maxillary sinusitis, unspecified: Secondary | ICD-10-CM

## 2014-02-03 DIAGNOSIS — J029 Acute pharyngitis, unspecified: Secondary | ICD-10-CM

## 2014-02-03 DIAGNOSIS — J209 Acute bronchitis, unspecified: Secondary | ICD-10-CM

## 2014-02-03 LAB — POCT INFLUENZA A/B
Influenza A, POC: NEGATIVE
Influenza B, POC: NEGATIVE

## 2014-02-03 LAB — POCT RAPID STREP A (OFFICE): Rapid Strep A Screen: NEGATIVE

## 2014-02-03 MED ORDER — AMOXICILLIN-POT CLAVULANATE 875-125 MG PO TABS: 1.0000 | ORAL_TABLET | Freq: Two times a day (BID) | ORAL | Status: DC

## 2014-02-03 NOTE — Patient Instructions (Signed)
Acute Bronchitis Bronchitis is inflammation of the airways that extend from the windpipe into the lungs (bronchi). The inflammation often causes mucus to develop. This leads to a cough, which is the most common symptom of bronchitis.  In acute bronchitis, the condition usually develops suddenly and goes away over time, usually in a couple weeks. Smoking, allergies, and asthma can make bronchitis worse. Repeated episodes of bronchitis may cause further lung problems.  CAUSES Acute bronchitis is most often caused by the same virus that causes a cold. The virus can spread from person to person (contagious) through coughing, sneezing, and touching contaminated objects. SIGNS AND SYMPTOMS   Cough.   Fever.   Coughing up mucus.   Body aches.   Chest congestion.   Chills.   Shortness of breath.   Sore throat.  DIAGNOSIS  Acute bronchitis is usually diagnosed through a physical exam. Your health care provider will also ask you questions about your medical history. Tests, such as chest X-rays, are sometimes done to rule out other conditions.  TREATMENT  Acute bronchitis usually goes away in a couple weeks. Oftentimes, no medical treatment is necessary. Medicines are sometimes given for relief of fever or cough. Antibiotic medicines are usually not needed but may be prescribed in certain situations. In some cases, an inhaler may be recommended to help reduce shortness of breath and control the cough. A cool mist vaporizer may also be used to help thin bronchial secretions and make it easier to clear the chest.  HOME CARE INSTRUCTIONS  Get plenty of rest.   Drink enough fluids to keep your urine clear or pale yellow (unless you have a medical condition that requires fluid restriction). Increasing fluids may help thin your respiratory secretions (sputum) and reduce chest congestion, and it will prevent dehydration.   Take medicines only as directed by your health care provider.  If  you were prescribed an antibiotic medicine, finish it all even if you start to feel better.  Avoid smoking and secondhand smoke. Exposure to cigarette smoke or irritating chemicals will make bronchitis worse. If you are a smoker, consider using nicotine gum or skin patches to help control withdrawal symptoms. Quitting smoking will help your lungs heal faster.   Reduce the chances of another bout of acute bronchitis by washing your hands frequently, avoiding people with cold symptoms, and trying not to touch your hands to your mouth, nose, or eyes.   Keep all follow-up visits as directed by your health care provider.  SEEK MEDICAL CARE IF: Your symptoms do not improve after 1 week of treatment.  SEEK IMMEDIATE MEDICAL CARE IF:  You develop an increased fever or chills.   You have chest pain.   You have severe shortness of breath.  You have bloody sputum.   You develop dehydration.  You faint or repeatedly feel like you are going to pass out.  You develop repeated vomiting.  You develop a severe headache. MAKE SURE YOU:   Understand these instructions.  Will watch your condition.  Will get help right away if you are not doing well or get worse. Document Released: 03/05/2004 Document Revised: 06/12/2013 Document Reviewed: 07/19/2012 ExitCare Patient Information 2015 [employer]. This information is not intended to replace advice given to you by your health care provider. Make sure you discuss any questions you have with your health care provider. Sinusitis Sinusitis is redness, soreness, and inflammation of the paranasal sinuses. Paranasal sinuses are air pockets within the bones of your face (beneath the   eyes, the middle of the forehead, or above the eyes). In healthy paranasal sinuses, mucus is able to drain out, and air is able to circulate through them by way of your nose. However, when your paranasal sinuses are inflamed, mucus and air can become trapped. This can  allow bacteria and other germs to grow and cause infection. Sinusitis can develop quickly and last only a short time (acute) or continue over a long period (chronic). Sinusitis that lasts for more than 12 weeks is considered chronic.  CAUSES  Causes of sinusitis include:  Allergies.  Structural abnormalities, such as displacement of the cartilage that separates your nostrils (deviated septum), which can decrease the air flow through your nose and sinuses and affect sinus drainage.  Functional abnormalities, such as when the small hairs (cilia) that line your sinuses and help remove mucus do not work properly or are not present. SIGNS AND SYMPTOMS  Symptoms of acute and chronic sinusitis are the same. The primary symptoms are pain and pressure around the affected sinuses. Other symptoms include:  Upper toothache.  Earache.  Headache.  Bad breath.  Decreased sense of smell and taste.  A cough, which worsens when you are lying flat.  Fatigue.  Fever.  Thick drainage from your nose, which often is green and may contain pus (purulent).  Swelling and warmth over the affected sinuses. DIAGNOSIS  Your health care provider will perform a physical exam. During the exam, your health care provider may:  Look in your nose for signs of abnormal growths in your nostrils (nasal polyps).  Tap over the affected sinus to check for signs of infection.  View the inside of your sinuses (endoscopy) using an imaging device that has a light attached (endoscope). If your health care provider suspects that you have chronic sinusitis, one or more of the following tests may be recommended:  Allergy tests.  Nasal culture. A sample of mucus is taken from your nose, sent to a lab, and screened for bacteria.  Nasal cytology. A sample of mucus is taken from your nose and examined by your health care provider to determine if your sinusitis is related to an allergy. TREATMENT  Most cases of acute  sinusitis are related to a viral infection and will resolve on their own within 10 days. Sometimes medicines are prescribed to help relieve symptoms (pain medicine, decongestants, nasal steroid sprays, or saline sprays).  However, for sinusitis related to a bacterial infection, your health care provider will prescribe antibiotic medicines. These are medicines that will help kill the bacteria causing the infection.  Rarely, sinusitis is caused by a fungal infection. In theses cases, your health care provider will prescribe antifungal medicine. For some cases of chronic sinusitis, surgery is needed. Generally, these are cases in which sinusitis recurs more than 3 times per year, despite other treatments. HOME CARE INSTRUCTIONS   Drink plenty of water. Water helps thin the mucus so your sinuses can drain more easily.  Use a humidifier.  Inhale steam 3 to 4 times a day (for example, sit in the bathroom with the shower running).  Apply a warm, moist washcloth to your face 3 to 4 times a day, or as directed by your health care provider.  Use saline nasal sprays to help moisten and clean your sinuses.  Take medicines only as directed by your health care provider.  If you were prescribed either an antibiotic or antifungal medicine, finish it all even if you start to feel better. SEEK IMMEDIATE MEDICAL   CARE IF:  You have increasing pain or severe headaches.  You have nausea, vomiting, or drowsiness.  You have swelling around your face.  You have vision problems.  You have a stiff neck.  You have difficulty breathing. MAKE SURE YOU:   Understand these instructions.  Will watch your condition.  Will get help right away if you are not doing well or get worse. Document Released: 01/26/2005 Document Revised: 06/12/2013 Document Reviewed: 02/10/2011 ExitCare Patient Information 2015 [employer]. This information is not intended to replace advice given to you by your health care provider.  Make sure you discuss any questions you have with your health care provider.  

## 2014-02-03 NOTE — Progress Notes (Signed)
Chief Complaint:  Chief Complaint  Patient presents with  . Cough    x1 week; getting worse; sometimes green sputum is produced  . Sore Throat  . Generalized Body Aches  . Nausea  . Chills    HPI: Melinda [name] is a 58 y.o. female who is here for  1 week hx of sinus sxs and started with coughing and yesterday started having aches and pains She has had sinus pressure and HA, mild ear pain on right side, chills, ubjective fevers, muscle aches started last night.  She has had flu vaccine, has tried otc meds without relief. 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   Past Medical History  Diagnosis Date  . Allergy   . MVP (mitral valve prolapse)   . Hyperlipidemia   . Hypertension   . Thyroid disease     had radioactive iodine for hyperthyroid  . Trigeminal neuralgia    Past Surgical History  Procedure Laterality Date  . Abdominal hysterectomy    . Cesarean section    . Heart stent      x1, March of 2012   History   Social History  . Marital Status: Widowed    Spouse Name: N/A    Number of Children: N/A  . Years of Education: N/A   Social History Main Topics  . Smoking status: Never Smoker   . Smokeless tobacco: Never Used  . Alcohol Use: No  . Drug Use: No  . Sexual Activity: None   Other Topics Concern  . None   Social History Narrative   Family History  Problem Relation Age of Onset  . Stomach cancer Sister   . Rectal cancer Neg Hx   . Colon cancer Neg Hx   . Esophageal cancer Neg Hx    Allergies  Allergen Reactions  . Bc Fast Pain Relief [Aspirin-Caffeine] Rash    swelling  . Tegretol [Carbamazepine]    Prior to Admission medications   Medication Sig Start Date End Date Taking? Authorizing Provider  aspirin 81 MG tablet Take 81 mg by mouth daily.   Yes Historical Provider, MD  carvedilol (COREG) 6.25 MG tablet Take 6.25 mg by mouth 2 (two) times daily with a  meal.   Yes Historical Provider, MD  cholecalciferol (VITAMIN D) 1000 UNITS tablet Take 1,000 Units by mouth 2 times daily at 12 noon and 4 pm. 3 times a week   Yes Historical Provider, MD  fexofenadine (ALLEGRA) 180 MG tablet Take 180 mg by mouth 1 day or 1 dose.   Yes Historical Provider, MD  levothyroxine (SYNTHROID, LEVOTHROID) 112 MCG tablet Take 112 mcg by mouth daily.   Yes Historical Provider, MD  Multiple Vitamins-Minerals (MULTIVITAMIN PO) Take by mouth daily.   Yes Historical Provider, MD  PARoxetine (PAXIL) 10 MG tablet Take 10 mg by mouth 1 day or 1 dose. nightly   Yes Historical Provider, MD  pravastatin (PRAVACHOL) 20 MG tablet Take 20 mg by mouth daily.   Yes Historical Provider, MD  Ticagrelor (BRILINTA) 90 MG TABS tablet Take 90 mg by mouth 2 (two) times daily.   Yes Historical Provider, MD  valACYclovir (VALTREX) 500 MG tablet Take 500 mg by mouth 2 (two) times daily.   Yes Historical Provider, MD     ROS: The patient deniesnight sweats, unintentional weight loss, chest pain, palpitations, wheezing, dyspnea on exertion, nausea, vomiting, abdominal  pain, dysuria, hematuria, melena, numbness,  or tingling.   All other systems have been reviewed and were otherwise negative with the exception of those mentioned in the HPI and as above.    PHYSICAL EXAM: Filed Vitals:   02/03/14 1258  BP: 164/70  Pulse: 64  Temp: 98.3 F (36.8 C)  Resp: 16   Filed Vitals:   02/03/14 1258  Height: 5\' 5"  (1.651 m)  Weight: 166 lb 12.8 oz (75.66 kg)   Body mass index is 27.76 kg/(m^2).  General: Alert, no acute distress HEENT:  Normocephalic, atraumatic, oropharynx patent. EOMI, PERRLA.   Erythematous throat, no exudates, TM normal, + sinus tenderness, + erythematous/boggy nasal mucosa Cardiovascular:  Regular rate and rhythm, no rubs murmurs or gallops. + MVP systolic  click No Carotid bruits, radial pulse intact. No pedal edema.  Respiratory: Clear to auscultation bilaterally.  No  wheezes, rales, or rhonchi.  No cyanosis, no use of accessory musculature GI: No organomegaly, abdomen is soft and non-tender, positive bowel sounds.  No masses. Skin: No rashes. Neurologic: Facial musculature symmetric. Psychiatric: Patient is appropriate throughout our interaction. Lymphatic: No cervical lymphadenopathy Musculoskeletal: Gait intact.   LABS: Results for orders placed or performed in visit on 02/03/14  POCT rapid strep A  Result Value Ref Range   Rapid Strep A Screen Negative Negative  POCT Influenza A/B  Result Value Ref Range   Influenza A, POC Negative    Influenza B, POC Negative      EKG/XRAY:   Primary read interpreted by Dr. Marin Comment at [facility].   ASSESSMENT/PLAN: Encounter Diagnoses  Name Primary?  . Flu-like symptoms   . Cough   . Acute pharyngitis, unspecified pharyngitis type   . Acute maxillary sinusitis, recurrence not specified Yes  . Acute bronchitis, unspecified organism    Augmentin  Delsyn otc OTC nasacort F/u prn   Gross sideeffects, risk and benefits, and alternatives of medications d/w patient. Patient is aware that all medications have potential sideeffects and we are unable to predict every sideeffect or drug-drug interaction that may occur.  Tondalaya Perren, [place], DO 02/03/2014 2:37 PM

## 2014-08-23 DIAGNOSIS — R7303 Prediabetes: Secondary | ICD-10-CM | POA: Insufficient documentation

## 2014-08-28 ENCOUNTER — Other Ambulatory Visit: Payer: Self-pay | Admitting: Nurse Practitioner

## 2014-08-28 DIAGNOSIS — Z1231 Encounter for screening mammogram for malignant neoplasm of breast: Secondary | ICD-10-CM

## 2014-10-02 ENCOUNTER — Ambulatory Visit
Admission: RE | Admit: 2014-10-02 | Discharge: 2014-10-02 | Disposition: A | Discharge status: Home / Self Care | Payer: BLUE CROSS/BLUE SHIELD | Source: Ambulatory Visit | Attending: Nurse Practitioner | Admitting: Nurse Practitioner

## 2014-10-02 DIAGNOSIS — Z1231 Encounter for screening mammogram for malignant neoplasm of breast: Secondary | ICD-10-CM

## 2014-11-23 DIAGNOSIS — D649 Anemia, unspecified: Secondary | ICD-10-CM | POA: Insufficient documentation

## 2015-09-11 ENCOUNTER — Other Ambulatory Visit: Payer: Self-pay | Admitting: Nurse Practitioner

## 2015-09-11 DIAGNOSIS — Z1231 Encounter for screening mammogram for malignant neoplasm of breast: Secondary | ICD-10-CM

## 2015-10-03 ENCOUNTER — Ambulatory Visit
Admission: RE | Admit: 2015-10-03 | Discharge: 2015-10-03 | Disposition: A | Discharge status: Home / Self Care | Payer: BLUE CROSS/BLUE SHIELD | Source: Ambulatory Visit | Attending: Nurse Practitioner | Admitting: Nurse Practitioner

## 2015-10-03 DIAGNOSIS — Z1231 Encounter for screening mammogram for malignant neoplasm of breast: Secondary | ICD-10-CM

## 2015-12-09 DIAGNOSIS — F5101 Primary insomnia: Secondary | ICD-10-CM | POA: Insufficient documentation

## 2016-08-03 ENCOUNTER — Encounter: Payer: Self-pay | Admitting: Gastroenterology

## 2016-08-24 ENCOUNTER — Other Ambulatory Visit: Payer: Self-pay | Admitting: Nurse Practitioner

## 2016-08-24 DIAGNOSIS — Z1231 Encounter for screening mammogram for malignant neoplasm of breast: Secondary | ICD-10-CM

## 2016-10-06 ENCOUNTER — Ambulatory Visit: Payer: BLUE CROSS/BLUE SHIELD

## 2016-10-28 ENCOUNTER — Ambulatory Visit
Admission: RE | Admit: 2016-10-28 | Discharge: 2016-10-28 | Disposition: A | Discharge status: Home / Self Care | Payer: BC Managed Care – PPO | Source: Ambulatory Visit | Attending: Nurse Practitioner | Admitting: Nurse Practitioner

## 2016-10-28 ENCOUNTER — Encounter (INDEPENDENT_AMBULATORY_CARE_PROVIDER_SITE_OTHER): Payer: Self-pay

## 2016-10-28 DIAGNOSIS — Z1231 Encounter for screening mammogram for malignant neoplasm of breast: Secondary | ICD-10-CM

## 2016-11-19 ENCOUNTER — Encounter: Payer: Self-pay | Admitting: Gastroenterology

## 2017-01-22 ENCOUNTER — Ambulatory Visit (AMBULATORY_SURGERY_CENTER): Payer: Self-pay | Admitting: *Deleted

## 2017-01-22 ENCOUNTER — Other Ambulatory Visit: Payer: Self-pay

## 2017-01-22 VITALS — Ht 64.0 in | Wt 162.0 lb

## 2017-01-22 DIAGNOSIS — Z86010 Personal history of colonic polyps: Secondary | ICD-10-CM

## 2017-01-22 MED ORDER — NA SULFATE-K SULFATE-MG SULF 17.5-3.13-1.6 GM/177ML PO SOLN: ORAL | 0 refills | Status: DC

## 2017-01-22 NOTE — Progress Notes (Signed)
Patient denies any allergies to eggs or soy. Patient denies any problems with anesthesia/sedation. Patient denies any oxygen use at home. Patient denies taking any diet/weight loss medications or blood thinners. EMMI education assisgned to patient on colonoscopy, this was explained and instructions given to patient. suprep coupon printed and given to pt.

## 2017-01-25 ENCOUNTER — Encounter: Payer: Self-pay | Admitting: Gastroenterology

## 2017-02-05 ENCOUNTER — Encounter: Payer: Self-pay | Admitting: Gastroenterology

## 2017-02-05 ENCOUNTER — Ambulatory Visit (AMBULATORY_SURGERY_CENTER): Payer: BC Managed Care – PPO | Admitting: Gastroenterology

## 2017-02-05 VITALS — BP 122/70 | HR 49 | Temp 97.8°F | Resp 17 | Ht 64.0 in | Wt 162.0 lb

## 2017-02-05 DIAGNOSIS — Z86010 Personal history of colonic polyps: Secondary | ICD-10-CM

## 2017-02-05 MED ORDER — SODIUM CHLORIDE 0.9 % IV SOLN: 500.0000 mL | INTRAVENOUS | Status: DC

## 2017-02-05 NOTE — Patient Instructions (Signed)
*Handout given to patient on hemorrhoids**    YOU HAD AN ENDOSCOPIC PROCEDURE TODAY AT [facility]:   Refer to the procedure report that was given to you for any specific questions about what was found during the examination.  If the procedure report does not answer your questions, please call your gastroenterologist to clarify.  If you requested that your care partner not be given the details of your procedure findings, then the procedure report has been included in a sealed envelope for you to review at your convenience later.  YOU SHOULD EXPECT: Some feelings of bloating in the abdomen. Passage of more gas than usual.  Walking can help get rid of the air that was put into your GI tract during the procedure and reduce the bloating. If you had a lower endoscopy (such as a colonoscopy or flexible sigmoidoscopy) you may notice spotting of blood in your stool or on the toilet paper. If you underwent a bowel prep for your procedure, you may not have a normal bowel movement for a few days.  Please Note:  You might notice some irritation and congestion in your nose or some drainage.  This is from the oxygen used during your procedure.  There is no need for concern and it should clear up in a day or so.  SYMPTOMS TO REPORT IMMEDIATELY:   Following lower endoscopy (colonoscopy or flexible sigmoidoscopy):  Excessive amounts of blood in the stool  Significant tenderness or worsening of abdominal pains  Swelling of the abdomen that is new, acute  Fever of 100F or higher   For urgent or emergent issues, a gastroenterologist can be reached at any hour by calling [phone].   DIET:  We do recommend a small meal at first, but then you may proceed to your regular diet.  Drink plenty of fluids but you should avoid alcoholic beverages for 24 hours.  ACTIVITY:  You should plan to take it easy for the rest of today and you should NOT DRIVE or use heavy machinery until tomorrow (because  of the sedation medicines used during the test).    FOLLOW UP: Our staff will call the number listed on your records the next business day following your procedure to check on you and address any questions or concerns that you may have regarding the information given to you following your procedure. If we do not reach you, we will leave a message.  However, if you are feeling well and you are not experiencing any problems, there is no need to return our call.  We will assume that you have returned to your regular daily activities without incident.  If any biopsies were taken you will be contacted by phone or by letter within the next 1-3 weeks.  Please call us at [phone] if you have not heard about the biopsies in 3 weeks.    SIGNATURES/CONFIDENTIALITY: You and/or your care partner have signed paperwork which will be entered into your electronic medical record.  These signatures attest to the fact that that the information above on your After Visit Summary has been reviewed and is understood.  Full responsibility of the confidentiality of this discharge information lies with you and/or your care-partner.YOU HAD AN ENDOSCOPIC PROCEDURE TODAY AT [facility]:   Refer to the procedure report that was given to you for any specific questions about what was found during the examination.  If the procedure report does not answer your questions, please call your gastroenterologist  to clarify.  If you requested that your care partner not be given the details of your procedure findings, then the procedure report has been included in a sealed envelope for you to review at your convenience later.  YOU SHOULD EXPECT: Some feelings of bloating in the abdomen. Passage of more gas than usual.  Walking can help get rid of the air that was put into your GI tract during the procedure and reduce the bloating. If you had a lower endoscopy (such as a colonoscopy or flexible sigmoidoscopy) you may notice  spotting of blood in your stool or on the toilet paper. If you underwent a bowel prep for your procedure, you may not have a normal bowel movement for a few days.  Please Note:  You might notice some irritation and congestion in your nose or some drainage.  This is from the oxygen used during your procedure.  There is no need for concern and it should clear up in a day or so.  SYMPTOMS TO REPORT IMMEDIATELY:   Following lower endoscopy (colonoscopy or flexible sigmoidoscopy):  Excessive amounts of blood in the stool  Significant tenderness or worsening of abdominal pains  Swelling of the abdomen that is new, acute  Fever of 100F or higher   For urgent or emergent issues, a gastroenterologist can be reached at any hour by calling [phone].   DIET:  We do recommend a small meal at first, but then you may proceed to your regular diet.  Drink plenty of fluids but you should avoid alcoholic beverages for 24 hours.  ACTIVITY:  You should plan to take it easy for the rest of today and you should NOT DRIVE or use heavy machinery until tomorrow (because of the sedation medicines used during the test).    FOLLOW UP: Our staff will call the number listed on your records the next business day following your procedure to check on you and address any questions or concerns that you may have regarding the information given to you following your procedure. If we do not reach you, we will leave a message.  However, if you are feeling well and you are not experiencing any problems, there is no need to return our call.  We will assume that you have returned to your regular daily activities without incident.  If any biopsies were taken you will be contacted by phone or by letter within the next 1-3 weeks.  Please call us at [phone] if you have not heard about the biopsies in 3 weeks.    SIGNATURES/CONFIDENTIALITY: You and/or your care partner have signed paperwork which will be entered into your  electronic medical record.  These signatures attest to the fact that that the information above on your After Visit Summary has been reviewed and is understood.  Full responsibility of the confidentiality of this discharge information lies with you and/or your care-partner.

## 2017-02-05 NOTE — Op Note (Signed)
[facility] Patient Name: Melinda [name] Procedure Date: 02/05/2017 1:16 PM MRN: [id number] Endoscopist: Mauri Pole , MD Age: 61 Referring MD:  Date of Birth: 1955/12/12 Gender: Female Account #: [id number] Procedure:                Colonoscopy Indications:              High risk colon cancer surveillance: Personal                            history of colonic polyps, Surveillance: Personal                            history of adenomatous polyps on last colonoscopy 5                            years ago, High risk colon cancer surveillance:                            Personal history of adenoma less than 10 mm in size Medicines:                Monitored Anesthesia Care Procedure:                Pre-Anesthesia Assessment:                           - Prior to the procedure, a History and Physical                            was performed, and patient medications and                            allergies were reviewed. The patient's tolerance of                            previous anesthesia was also reviewed. The risks                            and benefits of the procedure and the sedation                            options and risks were discussed with the patient.                            All questions were answered, and informed consent                            was obtained. Prior Anticoagulants: The patient has                            taken no previous anticoagulant or antiplatelet                            agents. ASA Grade Assessment: II - A patient with  mild systemic disease. After reviewing the risks                            and benefits, the patient was deemed in                            satisfactory condition to undergo the procedure.                           After obtaining informed consent, the colonoscope                            was passed under direct vision. Throughout the                            procedure,  the patient's blood pressure, pulse, and                            oxygen saturations were monitored continuously. The                            Colonoscope was introduced through the anus and                            advanced to the the terminal ileum, with                            identification of the appendiceal orifice and IC                            valve. The colonoscopy was performed without                            difficulty. The patient tolerated the procedure                            well. The quality of the bowel preparation was                            excellent. The terminal ileum, ileocecal valve,                            appendiceal orifice, and rectum were photographed. Scope In: 1:21:25 PM Scope Out: 1:32:35 PM Scope Withdrawal Time: 0 hours 7 minutes 7 seconds  Total Procedure Duration: 0 hours 11 minutes 10 seconds  Findings:                 The perianal and digital rectal examinations were                            normal.                           Non-bleeding internal hemorrhoids were found during  retroflexion. The hemorrhoids were medium-sized.                           The exam was otherwise without abnormality. Complications:            No immediate complications. Estimated Blood Loss:     Estimated blood loss: none. Impression:               - Non-bleeding internal hemorrhoids.                           - The examination was otherwise normal.                           - No specimens collected. Recommendation:           - Patient has a contact number available for                            emergencies. The signs and symptoms of potential                            delayed complications were discussed with the                            patient. Return to normal activities tomorrow.                            Written discharge instructions were provided to the                            patient.                            - Resume previous diet.                           - Continue present medications.                           - Repeat colonoscopy in 10 years for surveillance. Mauri Pole, MD 02/05/2017 1:36:05 PM This report has been signed electronically.

## 2017-02-05 NOTE — Progress Notes (Signed)
Report to PACU, RN, vss, BBS= Clear.  

## 2017-02-08 ENCOUNTER — Telehealth: Payer: Self-pay

## 2017-02-08 NOTE — Telephone Encounter (Signed)
  Follow up Call-  Call back number 02/05/2017  Post procedure Call Back phone  # [phone]  Permission to leave phone message Yes  Some recent data might be hidden     Patient questions:  Do you have a fever, pain , or abdominal swelling? No. Pain Score  0 *  Have you tolerated food without any problems? Yes.    Have you been able to return to your normal activities? Yes.    Do you have any questions about your discharge instructions: Diet   No. Medications  No. Follow up visit  No.  Do you have questions or concerns about your Care? No.  Actions: * If pain score is 4 or above: No action needed, pain <4.

## 2018-02-18 ENCOUNTER — Other Ambulatory Visit: Payer: Self-pay | Admitting: Nurse Practitioner

## 2018-02-18 DIAGNOSIS — Z1231 Encounter for screening mammogram for malignant neoplasm of breast: Secondary | ICD-10-CM

## 2018-03-31 ENCOUNTER — Ambulatory Visit
Admission: RE | Admit: 2018-03-31 | Discharge: 2018-03-31 | Disposition: A | Discharge status: Home / Self Care | Payer: BC Managed Care – PPO | Source: Ambulatory Visit | Attending: Nurse Practitioner | Admitting: Nurse Practitioner

## 2018-03-31 DIAGNOSIS — Z1231 Encounter for screening mammogram for malignant neoplasm of breast: Secondary | ICD-10-CM

## 2018-07-28 ENCOUNTER — Other Ambulatory Visit: Payer: Self-pay | Admitting: Cardiology

## 2018-09-07 ENCOUNTER — Ambulatory Visit: Payer: Self-pay | Admitting: Cardiology

## 2018-11-21 ENCOUNTER — Other Ambulatory Visit: Payer: Self-pay | Admitting: Cardiology

## 2019-01-30 ENCOUNTER — Other Ambulatory Visit: Payer: Self-pay | Admitting: Cardiology

## 2019-07-26 ENCOUNTER — Other Ambulatory Visit: Payer: Self-pay | Admitting: Nurse Practitioner

## 2019-07-26 DIAGNOSIS — Z1231 Encounter for screening mammogram for malignant neoplasm of breast: Secondary | ICD-10-CM

## 2019-07-28 ENCOUNTER — Ambulatory Visit
Admission: RE | Admit: 2019-07-28 | Discharge: 2019-07-28 | Disposition: A | Discharge status: Home / Self Care | Payer: Managed Care, Other (non HMO) | Source: Ambulatory Visit | Attending: Nurse Practitioner | Admitting: Nurse Practitioner

## 2019-07-28 ENCOUNTER — Other Ambulatory Visit: Payer: Self-pay

## 2019-07-28 DIAGNOSIS — Z1231 Encounter for screening mammogram for malignant neoplasm of breast: Secondary | ICD-10-CM

## 2020-01-29 ENCOUNTER — Other Ambulatory Visit: Payer: Self-pay | Admitting: Cardiology

## 2020-02-13 ENCOUNTER — Other Ambulatory Visit: Payer: Self-pay

## 2020-02-13 MED ORDER — CARVEDILOL 3.125 MG PO TABS: ORAL_TABLET | ORAL | 0 refills | Status: DC

## 2020-02-13 MED ORDER — PRAVASTATIN SODIUM 40 MG PO TABS: ORAL_TABLET | ORAL | 0 refills | Status: DC

## 2020-04-11 ENCOUNTER — Other Ambulatory Visit: Payer: Self-pay

## 2020-04-11 ENCOUNTER — Ambulatory Visit: Payer: Managed Care, Other (non HMO) | Admitting: Cardiology

## 2020-04-11 ENCOUNTER — Encounter: Payer: Self-pay | Admitting: Cardiology

## 2020-04-11 VITALS — BP 110/57 | HR 80 | Temp 98.7°F | Resp 17 | Ht 64.0 in | Wt 130.2 lb

## 2020-04-11 DIAGNOSIS — R739 Hyperglycemia, unspecified: Secondary | ICD-10-CM

## 2020-04-11 DIAGNOSIS — I10 Essential (primary) hypertension: Secondary | ICD-10-CM

## 2020-04-11 DIAGNOSIS — I2510 Atherosclerotic heart disease of native coronary artery without angina pectoris: Secondary | ICD-10-CM

## 2020-04-11 DIAGNOSIS — E7800 Pure hypercholesterolemia, unspecified: Secondary | ICD-10-CM

## 2020-04-11 MED ORDER — PRAVASTATIN SODIUM 40 MG PO TABS: 20.0000 mg | ORAL_TABLET | Freq: Every evening | ORAL | 3 refills | Status: DC

## 2020-04-11 MED ORDER — CARVEDILOL 3.125 MG PO TABS: ORAL_TABLET | ORAL | 3 refills | Status: DC

## 2020-04-11 MED ORDER — IRBESARTAN-HYDROCHLOROTHIAZIDE 150-12.5 MG PO TABS: 1.0000 | ORAL_TABLET | ORAL | 3 refills | Status: DC

## 2020-04-11 NOTE — Progress Notes (Signed)
Primary Physician/Referring:  Berkley Harvey, NP  Patient ID: [name], female    DOB: 05/22/1955, 65 y.o.   MRN: [id number]  Chief Complaint  Patient presents with  . Medication Refill   HPI:    Beautiful Pensyl  is a 65 y.o. African-American female who is fairly active and has history of coronary artery disease and had undergone angioplasty to her mid LAD by implanting a drug-eluting 3.0 x 18 mm resolute stent on 05/08/2010. Past medical history significant for hypertension, hyperlipidemia and hyperglycemia.  I last seen her in 2019, she now presents to reestablish care, needed medication refills as well. Fortunately has remained stable and continues to be active and has not had any recurrence of chest pain. She is asymptomatic.  Past Medical History:  Diagnosis Date  . Allergy   . Hyperlipidemia   . Hypertension   . MVP (mitral valve prolapse)   . Thyroid disease    had radioactive iodine for hyperthyroid  . Trigeminal neuralgia    Past Surgical History:  Procedure Laterality Date  . ABDOMINAL HYSTERECTOMY  2008  . BUNIONECTOMY  2015  . CESAREAN SECTION    . heart stent     x1, March of 2012   Family History  Problem Relation Age of Onset  . Breast cancer Sister   . Stomach cancer Sister   . Rectal cancer Neg Hx   . Colon cancer Neg Hx   . Esophageal cancer Neg Hx     Social History   Tobacco Use  . Smoking status: Never Smoker  . Smokeless tobacco: Never Used  Substance Use Topics  . Alcohol use: No   Marital Status: Widowed   ROS  Review of Systems  Cardiovascular: Negative for chest pain, dyspnea on exertion and leg swelling.  Gastrointestinal: Negative for melena.   Objective  Blood pressure (!) 110/57, pulse 80, temperature 98.7 F (37.1 C), temperature source Temporal, resp. rate 17, height '5\' 4"'  (1.626 m), weight 130 lb 3.2 oz (59.1 kg), SpO2 96 %.  Vitals with BMI 04/11/2020 02/05/2017 02/05/2017  Height '5\' 4"'  - -  Weight 130 lbs 3 oz - -  BMI  97.35 - -  Systolic 329 924 268  Diastolic 57 70 68  Pulse 80 49 54     Physical Exam HENT:     Head: Atraumatic.  Cardiovascular:     Rate and Rhythm: Normal rate and regular rhythm.     Pulses: Intact distal pulses.     Heart sounds: S1 normal and S2 normal. Murmur heard.   Early systolic murmur is present with a grade of 2/6 at the upper right sternal border. No gallop.      Comments: No edema. No JVD.  Pulmonary:     Effort: Pulmonary effort is normal.     Breath sounds: Normal breath sounds.  Abdominal:     General: Bowel sounds are normal.     Palpations: Abdomen is soft.    Laboratory examination:   External labs:   Labs 02/28/2020:  Sodium 140, potassium 3.9, BUN 13, creatinine is 1.09, EGFR 57 mL.  Hepatic function normal.  A1c 5.7%.  TSH normal.  Vitamin D elevated at 72.  Total cholesterol 151, triglycerides 45, HDL 80, LDL 62, non-HDL cholesterol 71.  Medications and allergies   Allergies  Allergen Reactions  . Bc Fast Pain Relief [Aspirin-Caffeine] Rash    swelling  . Tegretol [Carbamazepine] Hives  . Belsomra [Suvorexant] Rash    Outpatient Medications  Prior to Visit  Medication Sig Dispense Refill  . aspirin 81 MG tablet Take 81 mg by mouth daily.    . cholecalciferol (VITAMIN D) 1000 UNITS tablet Take 1,000 Units by mouth 2 times daily at 12 noon and 4 pm. 3 times a week    . Coenzyme Q10 (COQ10) 200 MG CAPS Take 1 capsule by mouth daily.    . fexofenadine (ALLEGRA) 180 MG tablet Take 180 mg by mouth daily as needed.    . gabapentin (NEURONTIN) 100 MG capsule Take 100 mg by mouth as needed.    Marland Kitchen levothyroxine (SYNTHROID, LEVOTHROID) 112 MCG tablet Take 112 mcg by mouth daily.    . Multiple Vitamins-Minerals (MULTIVITAMIN PO) Take by mouth daily.    . valACYclovir (VALTREX) 500 MG tablet Take 500 mg by mouth 2 (two) times daily.    . carvedilol (COREG) 3.125 MG tablet TAKE 1 TABLET TWICE A DAY (CALL OFFICE FOR APPOINTMENT FOR FURTHER REFILLS) 180  tablet 0  . irbesartan-hydrochlorothiazide (AVALIDE) 150-12.5 MG tablet Take 1 tablet by mouth every morning.    . olmesartan-hydrochlorothiazide (BENICAR HCT) 40-25 MG tablet Take 1 tablet by mouth daily.    . pravastatin (PRAVACHOL) 20 MG tablet Take 20 mg by mouth daily.    . carvedilol (COREG) 6.25 MG tablet Take 3.125 mg by mouth 2 (two) times daily with a meal.    . Ferrous Sulfate (IRON) 28 MG TABS Take 1 tablet by mouth daily.    . pravastatin (PRAVACHOL) 40 MG tablet TAKE ONE-HALF (1/2) TABLET DAILY IN THE EVENING 45 tablet 0   No facility-administered medications prior to visit.   Radiology:   No results found.  Cardiac Studies:   Exercise myoview 04/08/12: 1. Resting EKG showed normal sinus rhythm. Stress EKG was equivocal for ischemia. There was 1.5 mmST depression noted at peak exercise, which resolved at < 2 minutes into recovery but reappeared at 3 minutes. Patient exercised on Bruce protocol for 10 minutes. The maximum work level achieved was 10.7 MET's. The baseline blood pressure was 130.82 mmHg and 200/90 mmHg with exercise. The test was terminated due to shortness of breath and achievement of the target heart rate. 2. Perfusion imaging study demonstrates normal perfusion without ischemia or scar. Normal left ventricular ejection fraction. Based on the stress results, continued secondary prevention is recommended.  Carotid duplex 03/03/12:  No evidence of hemodynamically significant stenosis in bilateral carotid bifurcation vessels. Normal study. Mild tortuosity of the left ICA noted.  Echocardiogram 12/31/2017: Left ventricle cavity is normal in size. Doppler evidence of grade II (pseudonormal) diastolic dysfunction, elevated LAP. Left ventricle regional wall motion findings: No wall motion abnormalities. Calculated EF 75%. Trileaflet aortic valve with mild (Grade I) regurgitation. Trace mitral regurgitation. Mild mitral valve leaflet thickening. Trace tricuspid  regurgitation. No evidence of pulmonary hypertension. Compared to 03/31/12, AI was trace, grade II diastolic dysfunction is new.  EKG:     EKG 04/11/2020: Marked sinus bradycardia at rate of 55 bpm, normal axis, no evidence of ischemia.  Low-voltage complexes.       Assessment     ICD-10-CM   1. Atherosclerosis of native coronary artery of native heart without angina pectoris  I25.10 carvedilol (COREG) 3.125 MG tablet  2. Primary hypertension  I10 EKG 12-Lead    irbesartan-hydrochlorothiazide (AVALIDE) 150-12.5 MG tablet  3. Hypercholesteremia  E78.00 pravastatin (PRAVACHOL) 40 MG tablet  4. Hyperglycemia  R73.9     Medications Discontinued During This Encounter  Medication Reason  . carvedilol (COREG)  6.25 MG tablet Error  . Ferrous Sulfate (IRON) 28 MG TABS Error  . pravastatin (PRAVACHOL) 40 MG tablet Error  . olmesartan-hydrochlorothiazide (BENICAR HCT) 40-25 MG tablet Error  . pravastatin (PRAVACHOL) 20 MG tablet Reorder  . carvedilol (COREG) 3.125 MG tablet Reorder  . irbesartan-hydrochlorothiazide (AVALIDE) 150-12.5 MG tablet Reorder    Meds ordered this encounter  Medications  . carvedilol (COREG) 3.125 MG tablet    Sig: TAKE 1 TABLET TWICE A DAY (CALL OFFICE FOR APPOINTMENT FOR FURTHER REFILLS)    Dispense:  180 tablet    Refill:  3  . pravastatin (PRAVACHOL) 40 MG tablet    Sig: Take 0.5 tablets (20 mg total) by mouth every evening.    Dispense:  90 tablet    Refill:  3  . irbesartan-hydrochlorothiazide (AVALIDE) 150-12.5 MG tablet    Sig: Take 1 tablet by mouth every morning.    Dispense:  90 tablet    Refill:  3   Orders Placed This Encounter  Procedures  . EKG 12-Lead   Recommendations:   Janaysia Mcleroy is a 65 y.o. African-American female who is fairly active and has history of coronary artery disease and had undergone angioplasty to her mid LAD by implanting a drug-eluting 3.0 x 18 mm resolute stent on 05/08/2010. Past medical history significant for  hypertension, hyperlipidemia and hyperglycemia.  She presents here to reestablish care, I last seen her in 2019. She is essentially asymptomatic. She continues to remain active. No change in physical exam, she has a very soft systolic ejection murmur in the right sternal border conducted to the carotids, she has known carotid disease. Lipids are well controlled, blood pressure is also well controlled. When I recheck her blood pressure it was slightly elevated, she will keep an eye on this. I have refilled her medications. She was previously on Benicar HCT but due to recall it was switched over to Avapro by her PCP, as the blood pressure is well controlled and labs are normal, I have resumed the same.  No changes in the medications were done by me today, I will see her back on annual basis.    Adrian Prows, MD, [facility] 04/11/2020, 2:59 PM Office: [phone]

## 2020-07-26 ENCOUNTER — Other Ambulatory Visit: Payer: Self-pay | Admitting: Nurse Practitioner

## 2020-07-26 DIAGNOSIS — Z1231 Encounter for screening mammogram for malignant neoplasm of breast: Secondary | ICD-10-CM

## 2020-09-24 ENCOUNTER — Ambulatory Visit
Admission: RE | Admit: 2020-09-24 | Discharge: 2020-09-24 | Disposition: A | Discharge status: Home / Self Care | Payer: Managed Care, Other (non HMO) | Source: Ambulatory Visit | Attending: Nurse Practitioner | Admitting: Nurse Practitioner

## 2020-09-24 ENCOUNTER — Other Ambulatory Visit: Payer: Self-pay

## 2020-09-24 DIAGNOSIS — Z1231 Encounter for screening mammogram for malignant neoplasm of breast: Secondary | ICD-10-CM

## 2021-04-03 ENCOUNTER — Other Ambulatory Visit: Payer: Self-pay | Admitting: Cardiology

## 2021-04-03 DIAGNOSIS — I2510 Atherosclerotic heart disease of native coronary artery without angina pectoris: Secondary | ICD-10-CM

## 2021-04-11 ENCOUNTER — Ambulatory Visit: Payer: Managed Care, Other (non HMO) | Admitting: Cardiology

## 2021-04-23 ENCOUNTER — Other Ambulatory Visit: Payer: Self-pay

## 2021-04-23 ENCOUNTER — Encounter: Payer: Self-pay | Admitting: Cardiology

## 2021-04-23 ENCOUNTER — Ambulatory Visit: Payer: Managed Care, Other (non HMO) | Admitting: Cardiology

## 2021-04-23 VITALS — BP 135/73 | HR 55 | Temp 98.0°F | Resp 16 | Ht 64.0 in | Wt 154.6 lb

## 2021-04-23 DIAGNOSIS — I10 Essential (primary) hypertension: Secondary | ICD-10-CM

## 2021-04-23 DIAGNOSIS — R739 Hyperglycemia, unspecified: Secondary | ICD-10-CM

## 2021-04-23 DIAGNOSIS — I2510 Atherosclerotic heart disease of native coronary artery without angina pectoris: Secondary | ICD-10-CM

## 2021-04-23 DIAGNOSIS — E7800 Pure hypercholesterolemia, unspecified: Secondary | ICD-10-CM

## 2021-04-23 NOTE — Progress Notes (Signed)
? ?Primary Physician/Referring:  Berkley Harvey, NP ? ?Patient ID: Melinda [name], female    DOB: 1955/11/22, 66 y.o.   MRN: [id number] ? ?No chief complaint on file. ? ?HPI:   ? ?Melinda [name]  is a 66 y.o. African-American female who is fairly active and has history of coronary artery disease and had undergone angioplasty to her mid LAD by implanting a drug-eluting 3.0 x 18 mm resolute stent on 05/08/2010. Past medical history significant for hypertension, hyperlipidemia and hyperglycemia. She is asymptomatic.   ? ?Past Medical History:  ?Diagnosis Date  ? Allergy   ? Hyperlipidemia   ? Hypertension   ? MVP (mitral valve prolapse)   ? Thyroid disease   ? had radioactive iodine for hyperthyroid  ? Trigeminal neuralgia   ? ?Past Surgical History:  ?Procedure Laterality Date  ? ABDOMINAL HYSTERECTOMY  2008  ? BUNIONECTOMY  2015  ? CESAREAN SECTION    ? heart stent    ? x1, March of 2012  ? ?Family History  ?Problem Relation Age of Onset  ? Breast cancer Sister   ? Stomach cancer Sister   ? Rectal cancer Neg Hx   ? Colon cancer Neg Hx   ? Esophageal cancer Neg Hx   ?  ?Social History  ? ?Tobacco Use  ? Smoking status: Never  ? Smokeless tobacco: Never  ?Substance Use Topics  ? Alcohol use: No  ? ?Marital Status: Widowed  ? ?ROS  ?Review of Systems  ?Cardiovascular:  Negative for chest pain, dyspnea on exertion and leg swelling.  ?Gastrointestinal:  Negative for melena.  ?Objective  ?Blood pressure 135/73, pulse (!) 55, temperature 98 ?F (36.7 ?C), temperature source Temporal, resp. rate 16, height _0  (1.626 m), weight 154 lb 9.6 oz (70.1 kg), SpO2 100 %.  ?Vitals with BMI 04/23/2021 04/11/2020 02/05/2017  ?Height _1  _2  -  ?Weight 154 lbs 10 oz 130 lbs 3 oz -  ?BMI 26.52 22.34 -  ?Systolic 573 220 254  ?Diastolic 73 57 70  ?Pulse 55 80 49  ?  ? Physical Exam ?HENT:  ?   Head: Atraumatic.  ?Cardiovascular:  ?   Rate and Rhythm: Normal rate and regular rhythm.  ?   Pulses: Intact distal pulses.  ?   Heart sounds: S1  normal and S2 normal. Murmur heard.  ?Early systolic murmur is present with a grade of 2/6 at the upper right sternal border.  ?  No gallop.  ?   Comments: No edema. No JVD.  ?Pulmonary:  ?   Effort: Pulmonary effort is normal.  ?   Breath sounds: Normal breath sounds.  ?Abdominal:  ?   General: Bowel sounds are normal.  ?   Palpations: Abdomen is soft.  ? ?Laboratory examination:  ? ?External labs:  ? ?Labs 02/28/2020: ? ?Sodium 140, potassium 3.9, BUN 13, creatinine is 1.09, EGFR 57 mL.  Hepatic function normal. ? ?A1c 5.7%.  TSH normal.  Vitamin D elevated at 72. ? ?Total cholesterol 151, triglycerides 45, HDL 80, LDL 62, non-HDL cholesterol 71. ? ?Medications and allergies  ? ?Allergies  ?Allergen Reactions  ? Bc Fast Pain Relief [Aspirin-Caffeine] Rash  ?  swelling  ? Tegretol [Carbamazepine] Hives  ? Amitriptyline Rash  ? Belsomra [Suvorexant] Rash  ?  ? ?Current Outpatient Medications:  ?  aspirin 81 MG tablet, Take 81 mg by mouth daily., Disp: , Rfl:  ?  carvedilol (COREG) 3.125 MG tablet, TAKE 1 TABLET TWICE A DAY.   (  NEED APPOINTMENT ), Disp: 180 tablet, Rfl: 0 ?  cholecalciferol (VITAMIN D) 1000 UNITS tablet, Take 1,000 Units by mouth 2 times daily at 12 noon and 4 pm. 3 times a week, Disp: , Rfl:  ?  Coenzyme Q10 (COQ10) 200 MG CAPS, Take 1 capsule by mouth daily., Disp: , Rfl:  ?  fexofenadine (ALLEGRA) 180 MG tablet, Take 180 mg by mouth daily as needed., Disp: , Rfl:  ?  gabapentin (NEURONTIN) 100 MG capsule, Take 100 mg by mouth as needed., Disp: , Rfl:  ?  irbesartan-hydrochlorothiazide (AVALIDE) 150-12.5 MG tablet, Take 1 tablet by mouth every morning., Disp: 90 tablet, Rfl: 3 ?  levothyroxine (SYNTHROID, LEVOTHROID) 112 MCG tablet, Take 112 mcg by mouth daily., Disp: , Rfl:  ?  Multiple Vitamins-Minerals (MULTIVITAMIN PO), Take by mouth daily., Disp: , Rfl:  ?  pravastatin (PRAVACHOL) 40 MG tablet, Take 0.5 tablets (20 mg total) by mouth every evening., Disp: 90 tablet, Rfl: 3 ?  valACYclovir  (VALTREX) 500 MG tablet, Take 500 mg by mouth 2 (two) times daily., Disp: , Rfl:   ? ?Radiology:  ? ?No results found. ? ?Cardiac Studies:  ? ?Exercise myoview 04/08/12: ?1. Resting EKG showed normal sinus rhythm. Stress EKG was equivocal for ischemia. There was 1.5 mmST depression noted at peak exercise, which resolved at < 2 minutes into recovery but reappeared at 3 minutes. Patient exercised on Bruce protocol for 10 minutes. The maximum work level achieved was 10.7 MET's. The baseline blood pressure was 130.82 mmHg and 200/90 mmHg with exercise. The test was terminated due to shortness of breath and achievement of the target heart rate. ?2. Perfusion imaging study demonstrates normal perfusion without ischemia or scar. Normal left ventricular ejection fraction. Based on the stress results, continued secondary prevention is recommended. ? ?Carotid duplex 03/03/12:  ?No evidence of hemodynamically significant stenosis in bilateral carotid bifurcation vessels. Normal study. Mild tortuosity of the left ICA noted. ? ?Echocardiogram 12/31/2017: ?Left ventricle cavity is normal in size. Doppler evidence of grade II (pseudonormal) diastolic dysfunction, elevated LAP. Left ventricle regional wall motion findings: No wall motion abnormalities. Calculated EF 75%. ?Trileaflet aortic valve with mild (Grade I) regurgitation. ?Trace mitral regurgitation. Mild mitral valve leaflet thickening. ?Trace tricuspid regurgitation. No evidence of pulmonary hypertension. ?Compared to 03/31/12,  AI was trace, grade II diastolic dysfunction is new. ? ?EKG:  ? ?EKG 04/11/2020: Marked sinus bradycardia at rate of 55 bpm, normal axis, no evidence of ischemia.  Low-voltage complexes.      ? ?Assessment  ? ?  ICD-10-CM   ?1. Atherosclerosis of native coronary artery of native heart without angina pectoris  I25.10 EKG 12-Lead  ?  ?2. Primary hypertension  I10   ?  ?3. Hypercholesteremia  E78.00   ?  ?4. Hyperglycemia  R73.9   ?  ?  ?There are no  discontinued medications. ?  ?No orders of the defined types were placed in this encounter. ? ?Orders Placed This Encounter  ?Procedures  ? EKG 12-Lead  ? ?Recommendations:  ? ?Rilyn Scroggs is a 66 y.o. African-American female who is fairly active and has history of coronary artery disease and had undergone angioplasty to her mid LAD by implanting a drug-eluting 3.0 x 18 mm resolute stent on 05/08/2010. Past medical history significant for hypertension, hyperlipidemia and hyperglycemia. ? ?She is essentially asymptomatic. She continues to remain active. No change in physical exam. Lipids are well controlled, blood pressure is also well controlled and  being managed by PCP.  She has hyperglycemia and this is stable. Diet discussed.  ? ?No changes in the medications were done by me today, I will see her back on annual basis.  She has annual physical exam coming with her PCP soon and she will have labs repeated there.  Goal LDL <70. ? ? ? ?Adrian Prows, MD, [facility] ?04/24/2021, 10:55 PM ?Office: [phone] ?

## 2021-06-26 ENCOUNTER — Other Ambulatory Visit: Payer: Self-pay | Admitting: Nurse Practitioner

## 2021-06-26 DIAGNOSIS — Z1231 Encounter for screening mammogram for malignant neoplasm of breast: Secondary | ICD-10-CM

## 2021-07-02 ENCOUNTER — Other Ambulatory Visit: Payer: Self-pay | Admitting: Cardiology

## 2021-07-02 DIAGNOSIS — I2510 Atherosclerotic heart disease of native coronary artery without angina pectoris: Secondary | ICD-10-CM

## 2021-07-02 DIAGNOSIS — I10 Essential (primary) hypertension: Secondary | ICD-10-CM

## 2021-08-15 ENCOUNTER — Other Ambulatory Visit: Payer: Self-pay | Admitting: Cardiology

## 2021-08-15 DIAGNOSIS — E7800 Pure hypercholesterolemia, unspecified: Secondary | ICD-10-CM

## 2021-08-31 ENCOUNTER — Emergency Department (HOSPITAL_COMMUNITY): Payer: Managed Care, Other (non HMO)

## 2021-08-31 ENCOUNTER — Encounter (HOSPITAL_COMMUNITY): Payer: Self-pay

## 2021-08-31 ENCOUNTER — Emergency Department (HOSPITAL_COMMUNITY)
Admission: EM | Admit: 2021-08-31 | Discharge: 2021-09-01 | Disposition: A | Discharge status: Home / Self Care | Payer: Managed Care, Other (non HMO) | Attending: Emergency Medicine | Admitting: Emergency Medicine

## 2021-08-31 ENCOUNTER — Other Ambulatory Visit: Payer: Self-pay

## 2021-08-31 DIAGNOSIS — R778 Other specified abnormalities of plasma proteins: Secondary | ICD-10-CM | POA: Diagnosis not present

## 2021-08-31 DIAGNOSIS — R079 Chest pain, unspecified: Secondary | ICD-10-CM

## 2021-08-31 DIAGNOSIS — Z7982 Long term (current) use of aspirin: Secondary | ICD-10-CM | POA: Insufficient documentation

## 2021-08-31 DIAGNOSIS — R0789 Other chest pain: Secondary | ICD-10-CM | POA: Diagnosis not present

## 2021-08-31 LAB — BASIC METABOLIC PANEL
Anion gap: 10 (ref 5–15)
BUN: 13 mg/dL (ref 8–23)
CO2: 26 mmol/L (ref 22–32)
Calcium: 9.9 mg/dL (ref 8.9–10.3)
Chloride: 103 mmol/L (ref 98–111)
Creatinine, Ser: 1.09 mg/dL — ABNORMAL HIGH (ref 0.44–1.00)
GFR, Estimated: 56 mL/min — ABNORMAL LOW (ref >60)
Glucose, Bld: 102 mg/dL — ABNORMAL HIGH (ref 70–99)
Potassium: 4.2 mmol/L (ref 3.5–5.1)
Sodium: 139 mmol/L (ref 135–145)

## 2021-08-31 LAB — CBC
HCT: 39 % (ref 36.0–46.0)
Hemoglobin: 12.6 g/dL (ref 12.0–15.0)
MCH: 27 pg (ref 26.0–34.0)
MCHC: 32.3 g/dL (ref 30.0–36.0)
MCV: 83.5 fL (ref 80.0–100.0)
Platelets: 213 10*3/uL (ref 150–400)
RBC: 4.67 MIL/uL (ref 3.87–5.11)
RDW: 12.5 % (ref 11.5–15.5)
WBC: 4.6 10*3/uL (ref 4.0–10.5)
nRBC: 0 % (ref 0.0–0.2)

## 2021-08-31 LAB — TROPONIN I (HIGH SENSITIVITY)
Troponin I (High Sensitivity): 35 ng/L — ABNORMAL HIGH (ref <18)
Troponin I (High Sensitivity): 39 ng/L — ABNORMAL HIGH (ref <18)

## 2021-08-31 LAB — TSH: TSH: 3.767 u[IU]/mL (ref 0.350–4.500)

## 2021-08-31 NOTE — ED Triage Notes (Signed)
Chest pain that is radiating to left arm.  Denies n/v/sob dizziness or diaphoresis. Stent placement 10 years ago

## 2021-08-31 NOTE — ED Provider Triage Note (Signed)
Emergency Medicine Provider Triage Evaluation Note  Melinda [name] , [name] 66 y.o. female  was evaluated in triage.  Pt complains of intermittent left-sided chest pain onset yesterday.  Denies her chest pain being exacerbated with rest or ambulation.  Notes that she was able to go on the treadmill yesterday without any issues.  No meds tried prior to arrival.  Has [name] history of MI with [name] cardiac catheterization in 2012 and 1 stent placed.  Her cardiologist is Dr. Einar Gip and she last saw him 6 months ago.  Has [name] history of diabetes that is diet controlled. Denies shortness of breath, nausea, vomiting.   Review of Systems  Positive: As per HPI Negative:   Physical Exam  BP (!) 156/70 (BP Location: Right Arm)   Pulse 62   Temp 98 F (36.7 C) (Oral)   Resp 18   Ht '5\' 4"'$  (1.626 m)   Wt 67.1 kg   SpO2 100%   BMI 25.40 kg/m  Gen:   Awake, no distress   Resp:  Normal effort  MSK:   Moves extremities without difficulty  Other:  No chest wall tenderness to palpation.  Medical Decision Making  Medically screening exam initiated at 6:28 PM.  Appropriate orders placed.  Melinda [name] was informed that the remainder of the evaluation will be completed by another provider, this initial triage assessment does not replace that evaluation, and the importance of remaining in the ED until their evaluation is complete.  Work-up initiated   Melinda Morua A, PA-C 08/31/21 1833

## 2021-09-01 ENCOUNTER — Emergency Department (HOSPITAL_COMMUNITY): Payer: Managed Care, Other (non HMO)

## 2021-09-01 MED ORDER — IOHEXOL 350 MG/ML SOLN
80.0000 mL | Freq: Once | INTRAVENOUS | Status: AC | PRN
  Administered 2021-09-01: 80 mL via INTRAVENOUS

## 2021-09-01 NOTE — ED Provider Notes (Signed)
[facility] EMERGENCY DEPARTMENT Provider Note   CSN: [id number] Arrival date & time: 08/31/21  1754     History  Chief Complaint  Patient presents with   Chest Pain    Melinda [name] is a 66 y.o. female.  Patient presents to the emergency department for evaluation of chest pain.  Patient reports that she first noticed the pain yesterday.  She has been having intermittent sharp pains in the left chest.  These pains occur randomly.  It usually lasts for about a minute and then resolves.  It is not related to exertion.  Patient reports that she was able to walk on the treadmill yesterday without having any pains.  She has not had any of the episodes for approximately 1-1/2 hours at time of my evaluation.  No associated shortness of breath.  No heart palpitations.       Home Medications Prior to Admission medications   Medication Sig Start Date End Date Taking? Authorizing Provider  aspirin 81 MG tablet Take 81 mg by mouth daily.    [provider]  carvedilol (COREG) 3.125 MG tablet TAKE 1 TABLET TWICE A DAY.   ( NEED APPOINTMENT ) 07/02/21   Adrian Prows, MD  cholecalciferol (VITAMIN D) 1000 UNITS tablet Take 1,000 Units by mouth 2 times daily at 12 noon and 4 pm. 3 times a week    [provider]  Coenzyme Q10 (COQ10) 200 MG CAPS Take 1 capsule by mouth daily.    [provider]  fexofenadine (ALLEGRA) 180 MG tablet Take 180 mg by mouth daily as needed.    [provider]  gabapentin (NEURONTIN) 100 MG capsule Take 100 mg by mouth as needed.    [provider]  irbesartan-hydrochlorothiazide (AVALIDE) 150-12.5 MG tablet TAKE 1 TABLET EVERY MORNING 07/02/21   Adrian Prows, MD  levothyroxine (SYNTHROID, LEVOTHROID) 112 MCG tablet Take 112 mcg by mouth daily.    [provider]  Multiple Vitamins-Minerals (MULTIVITAMIN PO) Take by mouth daily.    [provider]  pravastatin (PRAVACHOL) 40 MG tablet TAKE  ONE-HALF (1/2) TABLET EVERY EVENING 08/15/21   Adrian Prows, MD  valACYclovir (VALTREX) 500 MG tablet Take 500 mg by mouth 2 (two) times daily.    [provider]      Allergies    Bc fast pain relief [aspirin-caffeine], Tegretol [carbamazepine], Amitriptyline, and Belsomra [suvorexant]    Review of Systems   Review of Systems  Physical Exam Updated Vital Signs BP (!) 139/58   Pulse (!) 48   Temp 98.8 F (37.1 C) (Oral)   Resp 10   Ht '5\' 4"'$  (1.626 m)   Wt 67.1 kg   SpO2 100%   BMI 25.40 kg/m  Physical Exam Vitals and nursing note reviewed.  Constitutional:      General: She is not in acute distress.    Appearance: She is well-developed.  HENT:     Head: Normocephalic and atraumatic.     Mouth/Throat:     Mouth: Mucous membranes are moist.  Eyes:     General: Vision grossly intact. Gaze aligned appropriately.     Extraocular Movements: Extraocular movements intact.     Conjunctiva/sclera: Conjunctivae normal.  Cardiovascular:     Rate and Rhythm: Normal rate and regular rhythm.     Pulses: Normal pulses.     Heart sounds: Normal heart sounds, S1 normal and S2 normal. No murmur heard.    No friction rub. No gallop.  Pulmonary:  Effort: Pulmonary effort is normal. No respiratory distress.     Breath sounds: Normal breath sounds.  Abdominal:     General: Bowel sounds are normal.     Palpations: Abdomen is soft.     Tenderness: There is no abdominal tenderness. There is no guarding or rebound.     Hernia: No hernia is present.  Musculoskeletal:        General: No swelling.     Cervical back: Full passive range of motion without pain, normal range of motion and neck supple. No spinous process tenderness or muscular tenderness. Normal range of motion.     Right lower leg: No edema.     Left lower leg: No edema.  Skin:    General: Skin is warm and dry.     Capillary Refill: Capillary refill takes less than 2 seconds.     Findings: No ecchymosis, erythema, rash  or wound.  Neurological:     General: No focal deficit present.     Mental Status: She is alert and oriented to person, place, and time.     GCS: GCS eye subscore is 4. GCS verbal subscore is 5. GCS motor subscore is 6.     Cranial Nerves: Cranial nerves 2-12 are intact.     Sensory: Sensation is intact.     Motor: Motor function is intact.     Coordination: Coordination is intact.  Psychiatric:        Attention and Perception: Attention normal.        Mood and Affect: Mood normal.        Speech: Speech normal.        Behavior: Behavior normal.     ED Results / Procedures / Treatments   Labs (all labs ordered are listed, but only abnormal results are displayed) Labs Reviewed  BASIC METABOLIC PANEL - Abnormal; Notable for the following components:      Result Value   Glucose, Bld 102 (*)    Creatinine, Ser 1.09 (*)    GFR, Estimated 56 (*)    All other components within normal limits  TROPONIN I (HIGH SENSITIVITY) - Abnormal; Notable for the following components:   Troponin I (High Sensitivity) 35 (*)    All other components within normal limits  TROPONIN I (HIGH SENSITIVITY) - Abnormal; Notable for the following components:   Troponin I (High Sensitivity) 39 (*)    All other components within normal limits  CBC  TSH    EKG EKG Interpretation  Date/Time:  Sunday August 31 2021 18:10:55 EDT Ventricular Rate:  58 PR Interval:  170 QRS Duration: 64 QT Interval:  442 QTC Calculation: 433 R Axis:   70 Text Interpretation: Sinus bradycardia with sinus arrhythmia Nonspecific ST and T wave abnormality Abnormal ECG When compared with ECG of 09-May-2010 11:32, PREVIOUS ECG IS PRESENT Confirmed by Orpah Greek [phone]) on 08/31/2021 11:38:53 PM  Radiology CT Angio Chest Pulmonary Embolism (PE) W or WO Contrast  Result Date: 09/01/2021 CLINICAL DATA:  Chest pain, evaluate for PE EXAM: CT ANGIOGRAPHY CHEST WITH CONTRAST TECHNIQUE: Multidetector CT imaging of the chest was  performed using the standard protocol during bolus administration of intravenous contrast. Multiplanar CT image reconstructions and MIPs were obtained to evaluate the vascular anatomy. RADIATION DOSE REDUCTION: This exam was performed according to the departmental dose-optimization program which includes automated exposure control, adjustment of the mA and/or kV according to patient size and/or use of iterative reconstruction technique. CONTRAST:  68m OMNIPAQUE IOHEXOL 350 MG/ML  SOLN COMPARISON:  Chest radiograph dated 08/31/2021 FINDINGS: Cardiovascular: Satisfactory opacification of the bilateral pulmonary arteries to the segmental level. No evidence of pulmonary embolism. Study is not tailored for evaluation of the thoracic aorta. No evidence of thoracic aortic aneurysm. Mild atherosclerotic calcifications. The heart is normal in size.  No pericardial effusion. Coronary atherosclerosis of the LAD. Mediastinum/Nodes: No suspicious mediastinal lymphadenopathy. Visualized thyroid is unremarkable. Lungs/Pleura: Mild biapical pleural-parenchymal scarring. Mild centrilobular emphysematous changes, upper lung predominant. No suspicious pulmonary nodules. No focal consolidation. No pleural effusion or pneumothorax. Upper Abdomen: Visualized upper abdomen is notable for vascular calcifications. Musculoskeletal: Visualized osseous structures are within normal limits. Review of the MIP images confirms the above findings. IMPRESSION: No evidence of pulmonary embolism. No evidence of acute cardiopulmonary disease. Aortic Atherosclerosis (ICD10-I70.0) and Emphysema (ICD10-J43.9). Electronically Signed   By: Julian Hy M.D.   On: 09/01/2021 02:56   DG Chest 2 View  Result Date: 08/31/2021 CLINICAL DATA:  Chest pain EXAM: CHEST - 2 VIEW COMPARISON:  05/08/2010 FINDINGS: The heart size and mediastinal contours are within normal limits. Both lungs are clear. The visualized skeletal structures are unremarkable.  IMPRESSION: No active cardiopulmonary disease. Electronically Signed   By: Lucienne Capers M.D.   On: 08/31/2021 19:21    Procedures Procedures    Medications Ordered in ED Medications  iohexol (OMNIPAQUE) 350 MG/ML injection 80 mL (80 mLs Intravenous Contrast Given 09/01/21 0252)    ED Course/ Medical Decision Making/ A&P                           Medical Decision Making Amount and/or Complexity of Data Reviewed Labs: ordered. Radiology: ordered.  Risk Prescription drug management.   Patient presents with somewhat atypical chest pains.  Patient having nonexertional pains that occur sporadically and are not related to exertion.  EKG without any obvious ischemia, no ST elevations.  Patient is pain-free in the exam room.  She did, however, have an elevated first troponin at 35.  Repeat troponin is 39.  CT angiography performed, no evidence of PE or other pathology noted.  Does not appear to be in heart failure.  Discussed with her cardiologist, Dr. Einar Gip.  He feels that the patient can safely be discharged and follow-up in the office.  His office will call her later today for an appointment.  Patient is comfortable with this plan as she is not currently having any chest pain.  Patient will return to the ER for any new pain, especially any pain at his continuous or accompanied by nausea, diaphoresis, shortness of breath.        Final Clinical Impression(s) / ED Diagnoses Final diagnoses:  Chest pain, unspecified type    Rx / DC Orders ED Discharge Orders     None         Orpah Greek, MD 09/01/21 [phone]

## 2021-09-01 NOTE — Discharge Instructions (Signed)
Dr. Einar Gip will arrange for follow-up in the office.  If you develop any pain that lasts or is accompanied by shortness of breath, nausea, sweating or radiation to the other parts of the body, return to the ER for repeat evaluation.

## 2021-09-04 ENCOUNTER — Encounter: Payer: Self-pay | Admitting: Cardiology

## 2021-09-04 ENCOUNTER — Ambulatory Visit: Payer: Managed Care, Other (non HMO) | Admitting: Cardiology

## 2021-09-04 VITALS — BP 134/71 | HR 60 | Temp 97.8°F | Resp 16 | Ht 64.0 in | Wt 134.0 lb

## 2021-09-04 DIAGNOSIS — I2510 Atherosclerotic heart disease of native coronary artery without angina pectoris: Secondary | ICD-10-CM

## 2021-09-04 DIAGNOSIS — I10 Essential (primary) hypertension: Secondary | ICD-10-CM

## 2021-09-04 DIAGNOSIS — R0789 Other chest pain: Secondary | ICD-10-CM

## 2021-09-04 DIAGNOSIS — E7800 Pure hypercholesterolemia, unspecified: Secondary | ICD-10-CM

## 2021-09-04 NOTE — Progress Notes (Signed)
Primary Physician/Referring:  Berkley Harvey, NP  Patient ID: Melinda [name], female    DOB: 1955/07/09, 66 y.o.   MRN: [id number]  Chief Complaint  Patient presents with   Coronary Artery Disease   Follow-up    1year    HPI:    Melinda [name]  is a 66 y.o. African-American female who is fairly active and has history of coronary artery disease and had undergone angioplasty to her mid LAD by implanting a drug-eluting 3.0 x 18 mm resolute stent on 05/08/2010. Past medical history significant for hypertension, hyperlipidemia and hyperglycemia. She was seen in the emergency room with left upper chest pain on 08/31/2021, discharged home with negative EKG and serum troponins. Past Medical History:  Diagnosis Date   Allergy    Hyperlipidemia    Hypertension    MVP (mitral valve prolapse)    Thyroid disease    had radioactive iodine for hyperthyroid   Trigeminal neuralgia    Past Surgical History:  Procedure Laterality Date   ABDOMINAL HYSTERECTOMY  2008   BUNIONECTOMY  2015   CESAREAN SECTION     heart stent     x1, March of 2012   Family History  Problem Relation Age of Onset   Breast cancer Sister    Stomach cancer Sister    Rectal cancer Neg Hx    Colon cancer Neg Hx    Esophageal cancer Neg Hx     Social History   Tobacco Use   Smoking status: Never   Smokeless tobacco: Never  Substance Use Topics   Alcohol use: No   Marital Status: Widowed   ROS  Review of Systems  Cardiovascular:  Positive for chest pain. Negative for dyspnea on exertion and leg swelling.  Gastrointestinal:  Negative for melena.   Objective  Blood pressure 134/71, pulse 60, temperature 97.8 F (36.6 C), resp. rate 16, height '5\' 4"'  (1.626 m), weight 134 lb (60.8 kg), SpO2 99 %.     09/04/2021    3:41 PM 09/01/2021    3:53 AM 09/01/2021    3:00 AM  Vitals with BMI  Height '5\' 4"'     Weight 134 lbs    BMI 11.15    Systolic 520 802   Diastolic 71 60   Pulse 60 48 48     Physical  Exam Cardiovascular:     Rate and Rhythm: Normal rate and regular rhythm.     Pulses: Normal pulses and intact distal pulses.     Heart sounds: S1 normal and S2 normal. Murmur heard.     Harsh early systolic murmur is present with a grade of 2/6 at the upper right sternal border.     No gallop.     Comments: No edema. No JVD.  Pulmonary:     Effort: Pulmonary effort is normal.     Breath sounds: Normal breath sounds.  Abdominal:     General: Bowel sounds are normal.     Palpations: Abdomen is soft.    Laboratory examination:   External labs:   Labs 06/03/2021:  Sodium 137, potassium 3.8, BUN 9, creatinine 0.98, EGFR 65 mL, 6 serum glucose 88 mg.  Serum bilirubin 1.6, ALT, AST normal.  Total cholesterol 131, triglycerides 53, HDL 72, LDL 48.  Hb 12.3/HCT 37.5, platelets 215.  Mild macrocytosis present.   Medications and allergies   Allergies  Allergen Reactions   Bc Fast Pain Relief [Aspirin-Caffeine] Rash    swelling   Tegretol [Carbamazepine] Hives  Amitriptyline Rash   Belsomra [Suvorexant] Rash     Current Outpatient Medications:    aspirin 81 MG tablet, Take 81 mg by mouth daily., Disp: , Rfl:    carvedilol (COREG) 3.125 MG tablet, TAKE 1 TABLET TWICE A DAY.   ( NEED APPOINTMENT ), Disp: 180 tablet, Rfl: 3   cholecalciferol (VITAMIN D) 1000 UNITS tablet, Take 1,000 Units by mouth 2 times daily at 12 noon and 4 pm. 3 times a week, Disp: , Rfl:    Coenzyme Q10 (COQ10) 200 MG CAPS, Take 1 capsule by mouth daily., Disp: , Rfl:    fexofenadine (ALLEGRA) 180 MG tablet, Take 180 mg by mouth daily as needed., Disp: , Rfl:    gabapentin (NEURONTIN) 100 MG capsule, Take 100 mg by mouth as needed., Disp: , Rfl:    irbesartan-hydrochlorothiazide (AVALIDE) 150-12.5 MG tablet, TAKE 1 TABLET EVERY MORNING, Disp: 90 tablet, Rfl: 3   levothyroxine (SYNTHROID, LEVOTHROID) 112 MCG tablet, Take 112 mcg by mouth daily., Disp: , Rfl:    Multiple Vitamins-Minerals (MULTIVITAMIN PO), Take  by mouth daily., Disp: , Rfl:    pravastatin (PRAVACHOL) 40 MG tablet, TAKE ONE-HALF (1/2) TABLET EVERY EVENING, Disp: 90 tablet, Rfl: 3   valACYclovir (VALTREX) 500 MG tablet, Take 500 mg by mouth 2 (two) times daily., Disp: , Rfl:    Radiology:   No results found.  Cardiac Studies:   Exercise myoview 04/08/12: 1. Resting EKG showed normal sinus rhythm. Stress EKG was equivocal for ischemia. There was 1.5 mmST depression noted at peak exercise, which resolved at < 2 minutes into recovery but reappeared at 3 minutes. Patient exercised on Bruce protocol for 10 minutes. The maximum work level achieved was 10.7 MET's. The baseline blood pressure was 130.82 mmHg and 200/90 mmHg with exercise. The test was terminated due to shortness of breath and achievement of the target heart rate. 2. Perfusion imaging study demonstrates normal perfusion without ischemia or scar. Normal left ventricular ejection fraction. Based on the stress results, continued secondary prevention is recommended.  Carotid duplex 03/03/12:  No evidence of hemodynamically significant stenosis in bilateral carotid bifurcation vessels. Normal study. Mild tortuosity of the left ICA noted.  Echocardiogram 12/31/2017: Left ventricle cavity is normal in size. Doppler evidence of grade II (pseudonormal) diastolic dysfunction, elevated LAP. Left ventricle regional wall motion findings: No wall motion abnormalities. Calculated EF 75%. Trileaflet aortic valve with mild (Grade I) regurgitation. Trace mitral regurgitation. Mild mitral valve leaflet thickening. Trace tricuspid regurgitation. No evidence of pulmonary hypertension. Compared to 03/31/12,  AI was trace, grade II diastolic dysfunction is new.  EKG:   EKG 09/04/2021: Normal sinus rhythm at rate of 57 bpm, left atrial enlargement.  Otherwise normal EKG. NO SIGNIFICANT CHANGE FROM 04/11/2020.       Assessment     ICD-10-CM   1. Atherosclerosis of native coronary artery of native  heart without angina pectoris  I25.10 EKG 12-Lead    PCV ECHOCARDIOGRAM COMPLETE    PCV MYOCARDIAL PERFUSION WO LEXISCAN    2. Musculoskeletal chest pain  R07.89     3. Primary hypertension  I10     4. Hypercholesteremia  E78.00       There are no discontinued medications.   No orders of the defined types were placed in this encounter.  Orders Placed This Encounter  Procedures   PCV MYOCARDIAL PERFUSION WO LEXISCAN    Standing Status:   Future    Standing Expiration Date:   11/05/2021   EKG 12-Lead  PCV ECHOCARDIOGRAM COMPLETE    Standing Status:   Future    Standing Expiration Date:   09/05/2022   Recommendations:   Melinda [name] is a 66 y.o. African-American female who is fairly active and has history of coronary artery disease and had undergone angioplasty to her mid LAD by implanting a drug-eluting 3.0 x 18 mm resolute stent on 05/08/2010. Past medical history significant for hypertension, hyperlipidemia and hyperglycemia.  She was seen in the emergency room with left upper chest pain on 08/31/2021, discharged home with negative EKG and serum troponins.  Chest pain symptoms appear to be mostly musculoskeletal and very localized to her left upper part of the chest.  Do not suspect angina pectoris.  However she presented in an atypical fashion years ago when she had mid LAD stenosis.  Her stress test was close to 10 years ago.  Hence we will schedule him for exercise nuclear stress test and an echocardiogram.  Unless abnormal, I will see her back in a year.  Advised her to increase her physical activity slowly.  Blood pressure is well controlled, lipids are at goal, she does have hyperglycemia which is also at goal and unchanged from previous.    Melinda Prows, MD, [facility] 09/04/2021, 10:08 PM Office: [phone]

## 2021-09-04 NOTE — Patient Instructions (Signed)
Please do not take carvedilol the evening before stress test and in the morning of the stress test.

## 2021-09-25 ENCOUNTER — Ambulatory Visit
Admission: RE | Admit: 2021-09-25 | Discharge: 2021-09-25 | Disposition: A | Discharge status: Home / Self Care | Payer: Managed Care, Other (non HMO) | Source: Ambulatory Visit | Attending: Nurse Practitioner | Admitting: Nurse Practitioner

## 2021-09-25 DIAGNOSIS — Z1231 Encounter for screening mammogram for malignant neoplasm of breast: Secondary | ICD-10-CM

## 2021-10-09 ENCOUNTER — Other Ambulatory Visit: Payer: Managed Care, Other (non HMO)

## 2021-10-20 ENCOUNTER — Ambulatory Visit: Payer: Managed Care, Other (non HMO)

## 2021-10-20 DIAGNOSIS — I2510 Atherosclerotic heart disease of native coronary artery without angina pectoris: Secondary | ICD-10-CM

## 2021-10-21 LAB — PCV MYOCARDIAL PERFUSION WO LEXISCAN
Angina Index: 0
Base ST Depression (mm): 0 mm
ST Elevation (mm): 2 mm

## 2021-11-05 ENCOUNTER — Ambulatory Visit: Payer: Managed Care, Other (non HMO)

## 2021-11-05 DIAGNOSIS — I2510 Atherosclerotic heart disease of native coronary artery without angina pectoris: Secondary | ICD-10-CM

## 2021-11-20 IMAGING — MG MM DIGITAL SCREENING BILAT W/ TOMO AND CAD
6 of 10 series · 6 of 30 positions shown · non-contrast
Comparison: Previous exam(s).

CLINICAL DATA: Screening.

EXAM:
DIGITAL SCREENING BILATERAL MAMMOGRAM WITH TOMOSYNTHESIS AND CAD
TECHNIQUE: Bilateral screening digital craniocaudal and mediolateral oblique
mammograms were obtained. Bilateral screening digital breast
tomosynthesis was performed. The images were evaluated with
computer-aided detection.

[L MLO synth-2D]
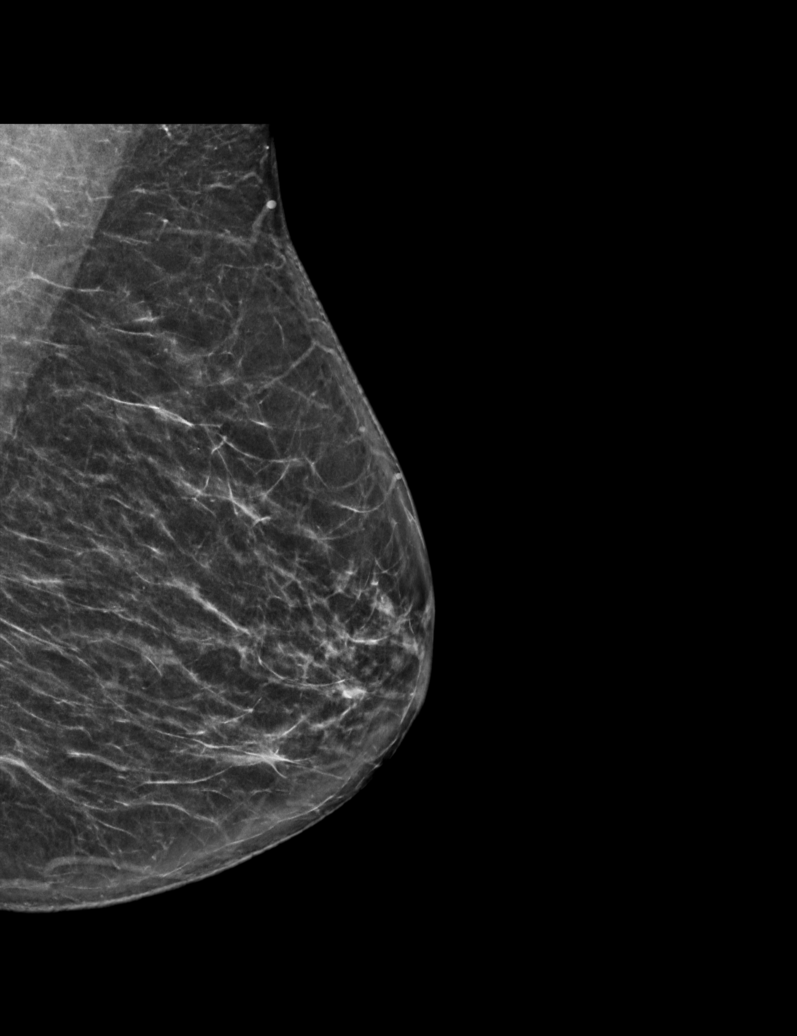

[R MLO synth-2D]
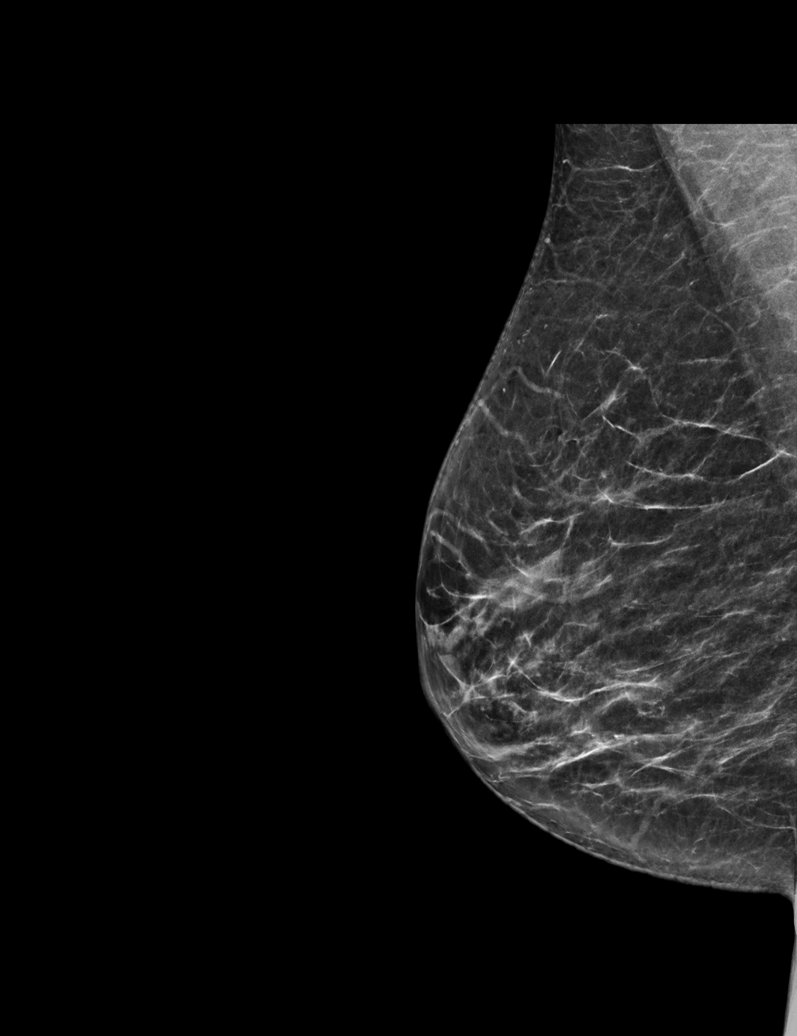

[L CC synth-2D]
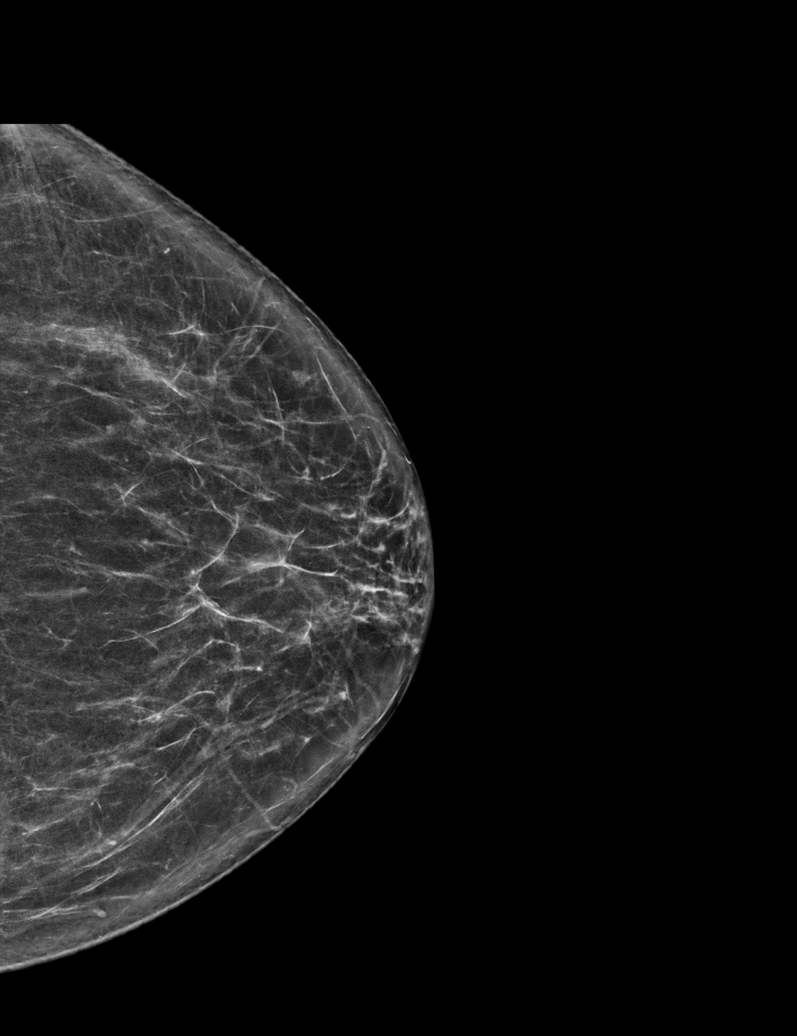

[R CC synth-2D (1 of 2)]
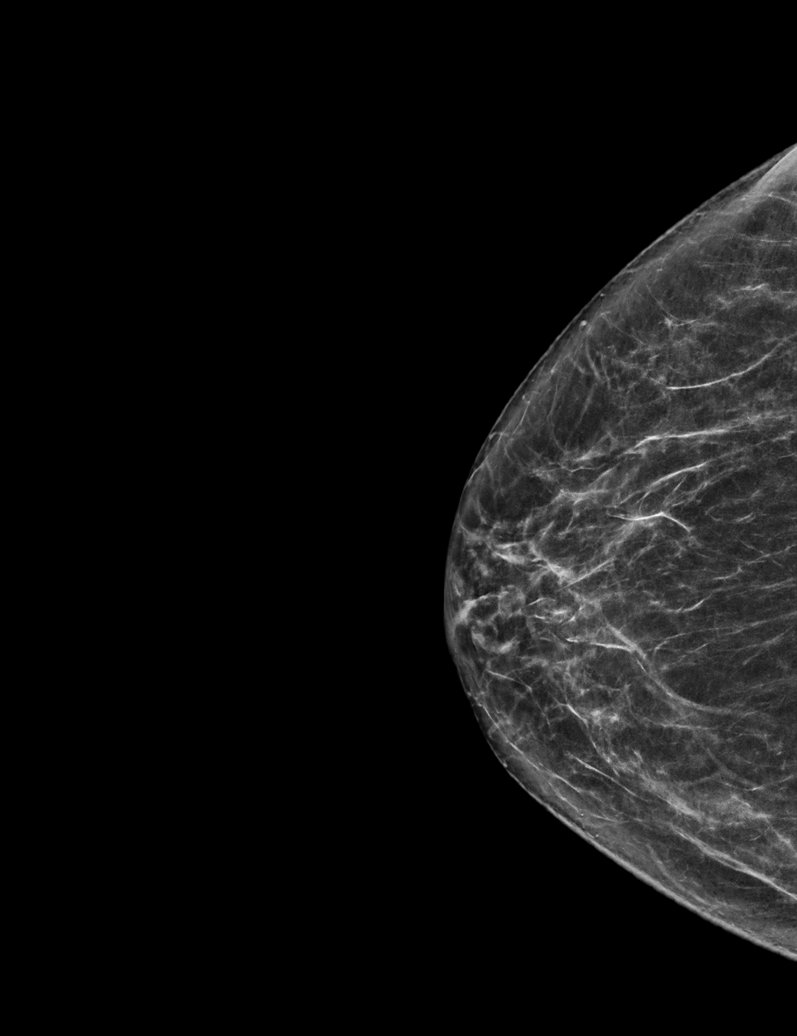

[R CC synth-2D (2 of 2)]
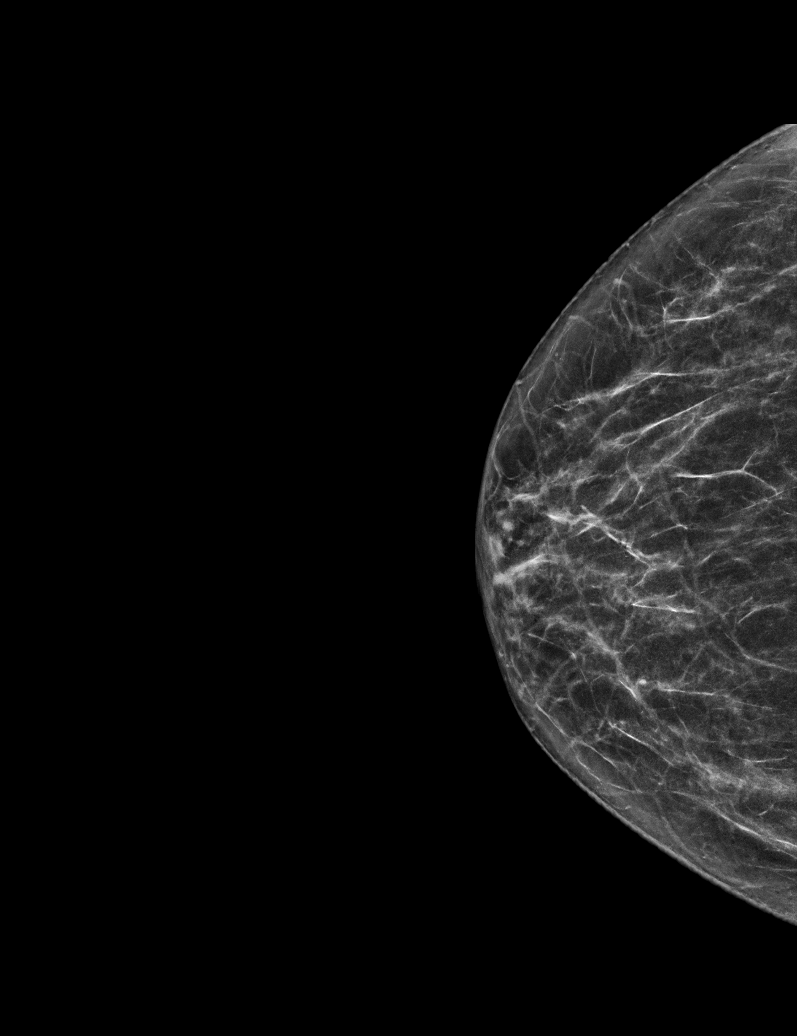

[L MLO tomo · tomo slice 33/66.0]
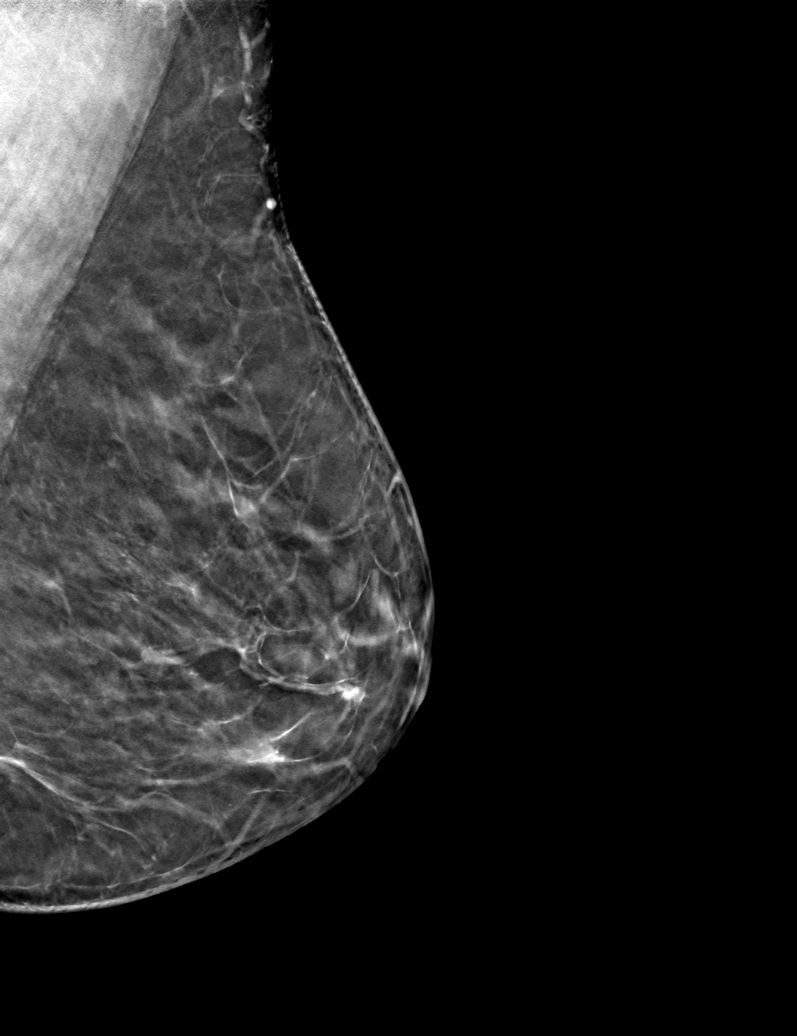

[6 of 30 positions shown; findings below may reference images not displayed]

ACR Breast Density Category b: There are scattered areas of
fibroglandular density.
FINDINGS: There are no findings suspicious for malignancy.
IMPRESSION: No mammographic evidence of malignancy. A result letter of this
screening mammogram will be mailed directly to the patient.

RECOMMENDATION:
Screening mammogram in one year. (Code:51-O-LD2)

BI-RADS CATEGORY  1: Negative.

## 2022-04-24 ENCOUNTER — Ambulatory Visit: Payer: Managed Care, Other (non HMO) | Admitting: Cardiology

## 2022-06-15 ENCOUNTER — Other Ambulatory Visit: Payer: Self-pay

## 2022-06-15 DIAGNOSIS — E2839 Other primary ovarian failure: Secondary | ICD-10-CM

## 2022-06-29 ENCOUNTER — Other Ambulatory Visit: Payer: Self-pay | Admitting: Cardiology

## 2022-06-29 DIAGNOSIS — I2510 Atherosclerotic heart disease of native coronary artery without angina pectoris: Secondary | ICD-10-CM

## 2022-06-29 DIAGNOSIS — I10 Essential (primary) hypertension: Secondary | ICD-10-CM

## 2022-08-26 ENCOUNTER — Ambulatory Visit: Payer: Managed Care, Other (non HMO) | Admitting: Cardiology

## 2022-08-26 ENCOUNTER — Encounter: Payer: Self-pay | Admitting: Cardiology

## 2022-08-26 VITALS — BP 126/49 | HR 59 | Ht 64.0 in | Wt 140.0 lb

## 2022-08-26 DIAGNOSIS — I10 Essential (primary) hypertension: Secondary | ICD-10-CM

## 2022-08-26 DIAGNOSIS — I2510 Atherosclerotic heart disease of native coronary artery without angina pectoris: Secondary | ICD-10-CM

## 2022-08-26 DIAGNOSIS — E7800 Pure hypercholesterolemia, unspecified: Secondary | ICD-10-CM

## 2022-08-26 NOTE — Progress Notes (Signed)
Primary Physician/Referring:  Iona Hansen, NP  Patient ID: Melinda [name], female    DOB: Mar 19, 1955, 67 y.o.   MRN: [id number]  Chief Complaint  Patient presents with  . Coronary Artery Disease  . Hyperlipidemia  . Follow-up    1 year    HPI:    Melinda [name]  is a 67 y.o. African-American female who is fairly active and has history of coronary artery disease and had undergone angioplasty to her mid LAD by implanting a drug-eluting 3.0 x 18 mm resolute stent on 05/08/2010. Past medical history significant for hypertension, hyperlipidemia and hyperglycemia.  This is annual visit, remains asymptomatic. Past Medical History:  Diagnosis Date  . Allergy   . Hyperlipidemia   . Hypertension   . MVP (mitral valve prolapse)   . Thyroid disease    had radioactive iodine for hyperthyroid  . Trigeminal neuralgia    Past Surgical History:  Procedure Laterality Date  . ABDOMINAL HYSTERECTOMY  2008  . BUNIONECTOMY  2015  . CESAREAN [name]    . heart stent     x1, March of 2012   Family History  Problem Relation Age of Onset  . Breast cancer Sister   . Stomach cancer Sister   . Rectal cancer Neg Hx   . Colon cancer Neg Hx   . Esophageal cancer Neg Hx     Social History   Tobacco Use  . Smoking status: Never  . Smokeless tobacco: Never  Substance Use Topics  . Alcohol use: No   Marital Status: Widowed   ROS  Review of Systems  Cardiovascular:  Negative for chest pain, dyspnea on exertion and leg swelling.   Objective  Blood pressure (!) 126/49, pulse (!) 59, height 5\' 4"  (1.626 m), weight 140 lb (63.5 kg), head circumference 16" (40.6 cm), SpO2 99%.     08/26/2022   10:59 AM 09/04/2021    3:41 PM 09/01/2021    3:53 AM  Vitals with BMI  Height 5\' 4"  5\' 4"    Weight 140 lbs 134 lbs   BMI 24.02 22.99   Systolic 126 134 409  Diastolic 49 71 60  Pulse 59 60 48     Physical Exam Cardiovascular:     Rate and Rhythm: Normal rate and regular rhythm.     Pulses: Normal  pulses and intact distal pulses.     Heart sounds: S1 normal and S2 normal. Murmur heard.     Harsh early systolic murmur is present with a grade of 2/6 at the upper right sternal border.     No gallop.  Pulmonary:     Effort: Pulmonary effort is normal.     Breath sounds: Normal breath sounds.  Abdominal:     General: Bowel sounds are normal.     Palpations: Abdomen is soft.  Musculoskeletal:     Right lower leg: No edema.     Left lower leg: No edema.   Laboratory examination:   External labs:   Labs 06/09/2022:  Sodium 136, potassium 4.3, BUN 11, creatinine 0.90, EGFR 71 mL, LFTs normal.  Total cholesterol 146, triglycerides 56, HDL 83, LDL 49.  A1c 5.7%.  TSH normal at 1.905.  Hb 12.9/HCT 37.9, platelets 216.  Radiology:   No results found.  Cardiac Studies:   Carotid duplex 03/03/12:  No evidence of hemodynamically significant stenosis in bilateral carotid bifurcation vessels. Normal study. Mild tortuosity of the left ICA noted.  PCV ECHOCARDIOGRAM COMPLETE 11/05/2021  Narrative Echocardiogram 11/05/2021: Normal  LV systolic function with visual EF 60-65%. Left ventricle cavity is normal in size. Normal left ventricular wall thickness. Normal global wall motion. Doppler evidence of grade I (impaired) diastolic dysfunction, normal LAP. Calculated EF 68%. Left atrial cavity is slightly dilated. Structurally normal trileaflet aortic valve.  Mild (Grade I) aortic regurgitation. Structurally normal tricuspid valve with trace regurgitation. No evidence of pulmonary hypertension. No significant change compared to 12/2017.    PCV MYOCARDIAL PERFUSION WO LEXISCAN 10/20/2021  Narrative Exercise nuclear stress test 10/20/2021: Abnormal ECG stress. Patient exercised for 7 minutes and 28 seconds of a Bruce protocol, achieving approximately 9.33 METs & 87% MPHR. The blood pressure response was normal. Peak EKG revealed 2 mm horizontal ST depression of the inferolateral leads  which resolved at 1 min into recovery but T wave inversions recurred at 2 minutes persisting for 3 min into recovery. No chest pain. Myocardial perfusion is normal. Overall LV systolic function is normal without regional wall motion abnormalities. Stress LV EF: 69%. Compared to the study done on 04/08/2012, no significant change.  EKG changes are also noted previously. Low risk.   EKG:   EKG 08/26/2022: Normal sinus rhythm at the rate of 54 bpm, left atrial enlargement, normal axis.  No evidence of ischemia.  Compared to 09/04/2021, no change.   Medications and allergies   Allergies  Allergen Reactions  . Bc Fast Pain Relief [Aspirin-Caffeine] Rash    swelling  . Tegretol [Carbamazepine] Hives  . Amitriptyline Rash  . Belsomra [Suvorexant] Rash    Current Outpatient Medications:  .  aspirin 81 MG tablet, Take 81 mg by mouth daily., Disp: , Rfl:  .  carvedilol (COREG) 3.125 MG tablet, TAKE 1 TABLET TWICE A DAY.   ( NEED APPOINTMENT ), Disp: 180 tablet, Rfl: 3 .  cholecalciferol (VITAMIN D) 1000 UNITS tablet, Take 1,000 Units by mouth 2 times daily at 12 noon and 4 pm. 3 times a week, Disp: , Rfl:  .  Coenzyme Q10 (COQ10) 200 MG CAPS, Take 1 capsule by mouth daily., Disp: , Rfl:  .  fexofenadine (ALLEGRA) 180 MG tablet, Take 180 mg by mouth daily as needed., Disp: , Rfl:  .  gabapentin (NEURONTIN) 100 MG capsule, Take 100 mg by mouth as needed., Disp: , Rfl:  .  irbesartan-hydrochlorothiazide (AVALIDE) 150-12.5 MG tablet, TAKE 1 TABLET EVERY MORNING, Disp: 90 tablet, Rfl: 3 .  levothyroxine (SYNTHROID, LEVOTHROID) 112 MCG tablet, Take 112 mcg by mouth daily. (1/2 tab) Tues and Thurs, all other days, Disp: , Rfl:  .  Multiple Vitamins-Minerals (MULTIVITAMIN PO), Take by mouth daily., Disp: , Rfl:  .  pravastatin (PRAVACHOL) 40 MG tablet, TAKE ONE-HALF (1/2) TABLET EVERY EVENING, Disp: 90 tablet, Rfl: 3 .  valACYclovir (VALTREX) 500 MG tablet, Take 500 mg by mouth 2 (two) times  daily., Disp: , Rfl:    Assessment     ICD-10-CM   1. Atherosclerosis of native coronary artery of native heart without angina pectoris  I25.10 EKG 12-Lead    2. Primary hypertension  I10     3. Hypercholesteremia  E78.00       There are no discontinued medications.   No orders of the defined types were placed in this encounter.  Orders Placed This Encounter  Procedures  . EKG 12-Lead   Recommendations:   Melinda [name] is a 67 y.o. African-American female who is fairly active and has history of coronary artery disease and had undergone angioplasty to her mid LAD by implanting  a drug-eluting 3.0 x 18 mm resolute stent on 05/08/2010. Past medical history significant for hypertension, hyperlipidemia and hyperglycemia.  1. Atherosclerosis of native coronary artery of native heart without angina pectoris Patient is presently doing well and essentially remains asymptomatic and continues to exercise on a regular basis.  No changes in the EKG. - EKG 12-Lead  2. Primary hypertension Patient's blood pressure is well-controlled, she is on a low-dose of a beta-blocker along with ARB, continue the same.  I reviewed her external labs, renal function is normal.  3. Hypercholesteremia She is on very small dose of moderate intensity statin, in spite of this lipids under excellent control.  HDL is also high.  Hence continue present medications, no changes in the medications were done today.  Her stress test that was done in September 2023 had revealed excellent exercise tolerance, no evidence of ischemia by perfusion.  Low risk stress test.  Echocardiogram also did not reveal any significant structural heart disease and LVEF was normal. I will continue to see her back on a annual basis.   Yates Decamp, MD, [facility] 08/26/2022, 11:22 AM Office: [phone]

## 2022-09-08 ENCOUNTER — Other Ambulatory Visit: Payer: Self-pay | Admitting: Nurse Practitioner

## 2022-09-08 DIAGNOSIS — E2839 Other primary ovarian failure: Secondary | ICD-10-CM

## 2022-09-08 DIAGNOSIS — Z1231 Encounter for screening mammogram for malignant neoplasm of breast: Secondary | ICD-10-CM

## 2022-09-28 ENCOUNTER — Ambulatory Visit
Admission: RE | Admit: 2022-09-28 | Discharge: 2022-09-28 | Disposition: A | Discharge status: Home / Self Care | Payer: Managed Care, Other (non HMO) | Source: Ambulatory Visit | Attending: Nurse Practitioner | Admitting: Nurse Practitioner

## 2022-09-28 DIAGNOSIS — Z1231 Encounter for screening mammogram for malignant neoplasm of breast: Secondary | ICD-10-CM

## 2022-10-19 ENCOUNTER — Other Ambulatory Visit: Payer: Self-pay | Admitting: Cardiology

## 2022-10-19 DIAGNOSIS — E7800 Pure hypercholesterolemia, unspecified: Secondary | ICD-10-CM

## 2023-06-10 ENCOUNTER — Other Ambulatory Visit: Payer: Self-pay | Admitting: Nurse Practitioner

## 2023-06-10 DIAGNOSIS — Z1231 Encounter for screening mammogram for malignant neoplasm of breast: Secondary | ICD-10-CM

## 2023-06-23 ENCOUNTER — Other Ambulatory Visit: Payer: Self-pay | Admitting: Cardiology

## 2023-06-23 DIAGNOSIS — I2510 Atherosclerotic heart disease of native coronary artery without angina pectoris: Secondary | ICD-10-CM

## 2023-08-25 ENCOUNTER — Ambulatory Visit: Payer: Managed Care, Other (non HMO) | Admitting: Cardiology

## 2023-08-26 ENCOUNTER — Other Ambulatory Visit: Payer: Self-pay | Admitting: Cardiology

## 2023-08-26 DIAGNOSIS — I2510 Atherosclerotic heart disease of native coronary artery without angina pectoris: Secondary | ICD-10-CM

## 2023-09-29 ENCOUNTER — Inpatient Hospital Stay
Admission: RE | Admit: 2023-09-29 | Discharge: 2023-09-29 | Discharge status: Home / Self Care | Source: Ambulatory Visit | Attending: Nurse Practitioner | Admitting: Nurse Practitioner

## 2023-09-29 DIAGNOSIS — Z1231 Encounter for screening mammogram for malignant neoplasm of breast: Secondary | ICD-10-CM

## 2023-10-14 ENCOUNTER — Other Ambulatory Visit: Payer: Self-pay | Admitting: Cardiology

## 2023-10-14 DIAGNOSIS — E7800 Pure hypercholesterolemia, unspecified: Secondary | ICD-10-CM
